# Patient Record
Sex: Male | Born: 1950 | Race: White | Hispanic: No | Marital: Married | Smoking: Former smoker
Health system: Southern US, Community
[De-identification: ages and names within clinical notes are randomized; demographics above are authoritative.]

## PROBLEM LIST (undated history)

## (undated) DIAGNOSIS — N393 Stress incontinence (female) (male): Secondary | ICD-10-CM

## (undated) DIAGNOSIS — Z8546 Personal history of malignant neoplasm of prostate: Secondary | ICD-10-CM

## (undated) DIAGNOSIS — J329 Chronic sinusitis, unspecified: Secondary | ICD-10-CM

## (undated) DIAGNOSIS — N529 Male erectile dysfunction, unspecified: Secondary | ICD-10-CM

## (undated) DIAGNOSIS — Z8719 Personal history of other diseases of the digestive system: Secondary | ICD-10-CM

## (undated) DIAGNOSIS — Z860100 Personal history of colon polyps, unspecified: Secondary | ICD-10-CM

## (undated) DIAGNOSIS — N50819 Testicular pain, unspecified: Secondary | ICD-10-CM

## (undated) DIAGNOSIS — M5136 Other intervertebral disc degeneration, lumbar region: Secondary | ICD-10-CM

## (undated) DIAGNOSIS — K219 Gastro-esophageal reflux disease without esophagitis: Secondary | ICD-10-CM

## (undated) DIAGNOSIS — K5730 Diverticulosis of large intestine without perforation or abscess without bleeding: Secondary | ICD-10-CM

## (undated) DIAGNOSIS — M51369 Other intervertebral disc degeneration, lumbar region without mention of lumbar back pain or lower extremity pain: Secondary | ICD-10-CM

## (undated) DIAGNOSIS — K648 Other hemorrhoids: Secondary | ICD-10-CM

## (undated) HISTORY — PX: INGUINAL HERNIA REPAIR: SUR1180

## (undated) HISTORY — PX: COLONOSCOPY: SHX174

## (undated) HISTORY — PX: HEMORROIDECTOMY: SUR656

## (undated) HISTORY — DX: Gastro-esophageal reflux disease without esophagitis: K21.9

## (undated) HISTORY — DX: Chronic sinusitis, unspecified: J32.9

---

## 1987-09-23 HISTORY — PX: ETHMOIDECTOMY: SHX5197

## 2000-02-10 ENCOUNTER — Ambulatory Visit (HOSPITAL_COMMUNITY): Admission: RE | Admit: 2000-02-10 | Discharge: 2000-02-10 | Payer: Self-pay | Admitting: Family Medicine

## 2000-02-10 ENCOUNTER — Encounter: Payer: Self-pay | Admitting: Family Medicine

## 2000-02-25 ENCOUNTER — Encounter: Payer: Self-pay | Admitting: Family Medicine

## 2000-02-25 ENCOUNTER — Ambulatory Visit (HOSPITAL_COMMUNITY): Admission: RE | Admit: 2000-02-25 | Discharge: 2000-02-25 | Payer: Self-pay | Admitting: Family Medicine

## 2004-08-07 ENCOUNTER — Ambulatory Visit (HOSPITAL_COMMUNITY): Admission: RE | Admit: 2004-08-07 | Discharge: 2004-08-07 | Payer: Self-pay | Admitting: Gastroenterology

## 2004-08-07 ENCOUNTER — Encounter (INDEPENDENT_AMBULATORY_CARE_PROVIDER_SITE_OTHER): Payer: Self-pay | Admitting: Specialist

## 2007-02-25 ENCOUNTER — Ambulatory Visit (HOSPITAL_COMMUNITY): Admission: RE | Admit: 2007-02-25 | Discharge: 2007-02-25 | Payer: Self-pay | Admitting: General Surgery

## 2007-02-25 ENCOUNTER — Encounter (INDEPENDENT_AMBULATORY_CARE_PROVIDER_SITE_OTHER): Payer: Self-pay | Admitting: General Surgery

## 2008-12-07 ENCOUNTER — Encounter: Admission: RE | Admit: 2008-12-07 | Discharge: 2008-12-07 | Payer: Self-pay | Admitting: Family Medicine

## 2010-08-01 ENCOUNTER — Encounter (INDEPENDENT_AMBULATORY_CARE_PROVIDER_SITE_OTHER): Payer: Self-pay | Admitting: Urology

## 2010-08-01 ENCOUNTER — Inpatient Hospital Stay (HOSPITAL_COMMUNITY): Admission: RE | Admit: 2010-08-01 | Discharge: 2010-08-02 | Payer: Self-pay | Admitting: Urology

## 2010-08-01 HISTORY — PX: ROBOT ASSISTED LAPAROSCOPIC RADICAL PROSTATECTOMY: SHX5141

## 2010-09-22 HISTORY — PX: CATARACT EXTRACTION W/ INTRAOCULAR LENS IMPLANT: SHX1309

## 2010-09-22 HISTORY — PX: RETINAL DETACHMENT SURGERY: SHX105

## 2010-12-01 ENCOUNTER — Emergency Department (HOSPITAL_COMMUNITY)
Admission: EM | Admit: 2010-12-01 | Discharge: 2010-12-01 | Disposition: A | Discharge status: Home / Self Care | Payer: BC Managed Care – PPO | Attending: Emergency Medicine | Admitting: Emergency Medicine

## 2010-12-01 DIAGNOSIS — N4830 Priapism, unspecified: Secondary | ICD-10-CM | POA: Insufficient documentation

## 2010-12-03 LAB — BASIC METABOLIC PANEL
BUN: 11 mg/dL (ref 6–23)
CO2: 27 mEq/L (ref 19–32)
Calcium: 9.2 mg/dL (ref 8.4–10.5)
Chloride: 109 mEq/L (ref 96–112)
Creatinine, Ser: 0.95 mg/dL (ref 0.4–1.5)
GFR calc Af Amer: >60 mL/min (ref >60)
GFR calc non Af Amer: >60 mL/min (ref >60)
Glucose, Bld: 101 mg/dL — ABNORMAL HIGH (ref 70–99)
Potassium: 4.3 mEq/L (ref 3.5–5.1)
Sodium: 143 mEq/L (ref 135–145)

## 2010-12-03 LAB — ABO/RH: ABO/RH(D): O POS

## 2010-12-03 LAB — SURGICAL PCR SCREEN: MRSA, PCR: NEGATIVE

## 2010-12-03 LAB — HEMOGLOBIN AND HEMATOCRIT, BLOOD
Hemoglobin: 12.5 g/dL — ABNORMAL LOW (ref 13.0–17.0)
Hemoglobin: 14.3 g/dL (ref 13.0–17.0)

## 2010-12-03 LAB — CBC
RBC: 5.07 MIL/uL (ref 4.22–5.81)
WBC: 6.5 10*3/uL (ref 4.0–10.5)

## 2010-12-03 LAB — TYPE AND SCREEN

## 2010-12-25 ENCOUNTER — Ambulatory Visit
Admission: RE | Admit: 2010-12-25 | Discharge: 2010-12-25 | Disposition: A | Discharge status: Home / Self Care | Payer: BC Managed Care – PPO | Source: Ambulatory Visit | Attending: Family Medicine | Admitting: Family Medicine

## 2010-12-25 ENCOUNTER — Other Ambulatory Visit: Payer: Self-pay | Admitting: Family Medicine

## 2010-12-25 DIAGNOSIS — R51 Headache: Secondary | ICD-10-CM

## 2010-12-25 DIAGNOSIS — M545 Low back pain: Secondary | ICD-10-CM

## 2011-02-04 NOTE — Op Note (Signed)
NAME:  Gavin Fowler, SASSO NO.:  [id number]   MEDICAL RECORD NO.:  [id number]          PATIENT TYPE:  AMB   LOCATION:  DAY                          FACILITY:  [facility]   PHYSICIAN:  Ollen Gross. Vernell Morgans, M.D. DATE OF BIRTH:  03-28-1951   DATE OF PROCEDURE:  02/25/2007  DATE OF DISCHARGE:                               OPERATIVE REPORT   PREOPERATIVE DIAGNOSIS:  Left inguinal hernia.   POSTOPERATIVE DIAGNOSIS:  Left indirect inguinal hernia.   PROCEDURE:  Left inguinal hernia repair with mesh.   SURGEON:  Ollen Gross. Vernell Morgans, M.D.   ANESTHESIA:  General endotracheal.   PROCEDURE:  After informed consent was obtained, the patient was brought  to the operating room and placed in the supine position on the operating  table.  After induction of general anesthesia, the patient's left groin  and abdomen were prepped with Betadine and draped in the usual sterile  manner.  The left groin area was infiltrated 0.25% Marcaine with  epinephrine.  A small incision was made from the edge of the pubic  tubercle on the left towards the anterior superior iliac spine.  This  incision was carried down through the skin and subcutaneous tissue  sharply with electrocautery.  Two small bridging veins were encountered  that were clamped with hemostats, divided, and ligated with 3-0 silk  ties.  The rest of the incision was opened sharply with electrocautery  until the fascia of the external oblique was encountered.  A Weitlaner  retractor was deployed.  The fascia of the external oblique was opened  along its fibers towards the apex of the external ring.  Blunt  dissection was then carried out of the cord structures until the cord  structures could be surrounded between two fingers.  A 1/2-inch Penrose  drain was placed around the cord structures for retraction purposes.  The cord structures were gently skeletonized and a lipoma of the cord as  well as the hernia sac were able to be identified and  gently separated  from the rest of the cord structures.  The ilioinguinal nerve and  iliofemoral nerves were both identified and were preserved.  Once the  cord lipoma was separated from the rest of the cord structures, it was  ligated at its base by clamping it with a hemostat and divided it and  ligating with a 2-0 silk tie.  The hernia sac, itself, was then opened  and there were no visceral contents within the sac.  The sac was ligated  near its base with a 2-0 silk suture ligature and the distal sac was  excised sharply and sent to pathology for identification.  The stump of  the sac was allowed to retract back beneath the floor of the canal.  The  floor of the canal was then reinforced with a couple of 0 Vicryl  stitches.  A piece of 3 x 6 Ultrapro mesh was chosen and cut to fit. The  mesh was sewed inferiorly to the shelving edge of the inguinal ligament  with a running 2-0 Prolene stitch.  The tails of  0 Vicryl stitch next to  the cord were brought through the mesh and tied down to anchor the mesh.  At that point, the tails were cut in the mesh laterally and these tails  were wrapped around the cord structures. Superiorly, the mesh was sewed  to the muscular aponeurotic strength layer of the transversalis with  interrupted 2-0 Prolene vertical mattress stitches.  The tails of the  mesh were anchored lateral to the cord to the shelving edge of the  inguinal ligament with an interrupted 2-0 Prolene stitch.  Once this was  accomplished, the mesh was in good position without any tension.  The  wound was irrigated with copious amounts of saline and the fascia of the  external oblique was then closed with a running 2-0 Vicryl stitch. The  wound was then infiltrated with 0.25% Marcaine.  Care was taken not to  involve the ilioinguinal nerve when closing the fascia.  The  subcutaneous fascia was then closed with a running 3-0 Vicryl stitch and  the skin was closed with a running 4-0  Monocryl subcuticular stitch.  Benzoin, Steri-Strips and Dermabond dressing was then applied.  The  patient  tolerated the procedure well.  At the end of the case, all needle,  sponge, and instrument counts were correct.  The patient was awakened  and taken to recovery in stable condition.  The patient's testicle was  in the scrotum at the end of case.      Ollen Gross. Vernell Morgans, M.D.  Electronically Signed     PST/MEDQ  D:  02/25/2007  T:  02/25/2007  Job:  [id number]

## 2011-02-07 NOTE — Op Note (Signed)
NAME:  Gavin Fowler, Gavin Fowler NO.:  [id number]   MEDICAL RECORD NO.:  [id number]          PATIENT TYPE:  AMB   LOCATION:  ENDO                         FACILITY:  [facility]   PHYSICIAN:  Graylin Shiver, M.D.   DATE OF BIRTH:  [birth year]   DATE OF PROCEDURE:  08/07/2004  DATE OF DISCHARGE:                                 OPERATIVE REPORT   PROCEDURE PERFORMED:  Colonoscopy with polypectomy and biopsy.   INDICATIONS FOR PROCEDURE:  Family history of colon cancer.   Informed consent was obtained after explanation of the risks of bleeding,  infection, and perforation.   PREMEDICATIONS:  Fentanyl 100 mcg  IV, Versed 10 mg IV.   DESCRIPTION OF PROCEDURE:  With the patient in the left lateral decubitus  position, a rectal exam was performed and no masses were felt.  The Olympus  colonoscope was inserted into the rectum and advanced around the colon to  the cecum.  Cecal landmarks were identified.  The cecum and ascending colon  were normal.  In the transverse colon there were two small 3 mm polyps  biopsied off with cold forceps.  The descending colon looked normal.  A few  diverticula were noted in the sigmoid region.  Also in the sigmoid there was  an 8 mm pedunculated polyp snared and removed by snare cautery technique.  The polyp was retrieved and the cautery site looked good.  In the rectum  there was a small 3 mm polyp biopsied off with cold forceps.  The patient  tolerated the procedure well without complications.   IMPRESSION:  Colon polyps.   PLAN:  The pathology will be checked.       SFG/MEDQ  D:  08/07/2004  T:  08/07/2004  Job:  [id number]   cc:   Meredith Staggers, M.D.  [address]  [place]  [place] [address]  Fax: [phone]

## 2011-07-10 LAB — BASIC METABOLIC PANEL
BUN: 10
Creatinine, Ser: 0.98
GFR calc non Af Amer: >60
Glucose, Bld: 94
Potassium: 3.9

## 2011-07-10 LAB — CBC
HCT: 47.6
MCV: 88.9
Platelets: 276
RDW: 13.9

## 2011-07-10 LAB — DIFFERENTIAL
Basophils Absolute: 0
Eosinophils Absolute: 0.1
Eosinophils Relative: 1
Lymphocytes Relative: 21
Neutrophils Relative %: 70

## 2011-12-28 IMAGING — CR DG CHEST 2V
2 series · 2 of 2 positions shown · non-contrast
Comparison: 12/07/2008 study

CLINICAL DATA: History of prostatic carcinoma.  Previous tobacco
smoker.  Preoperative cardiopulmonary evaluation.

CHEST - 2 VIEW

[w chest pa]
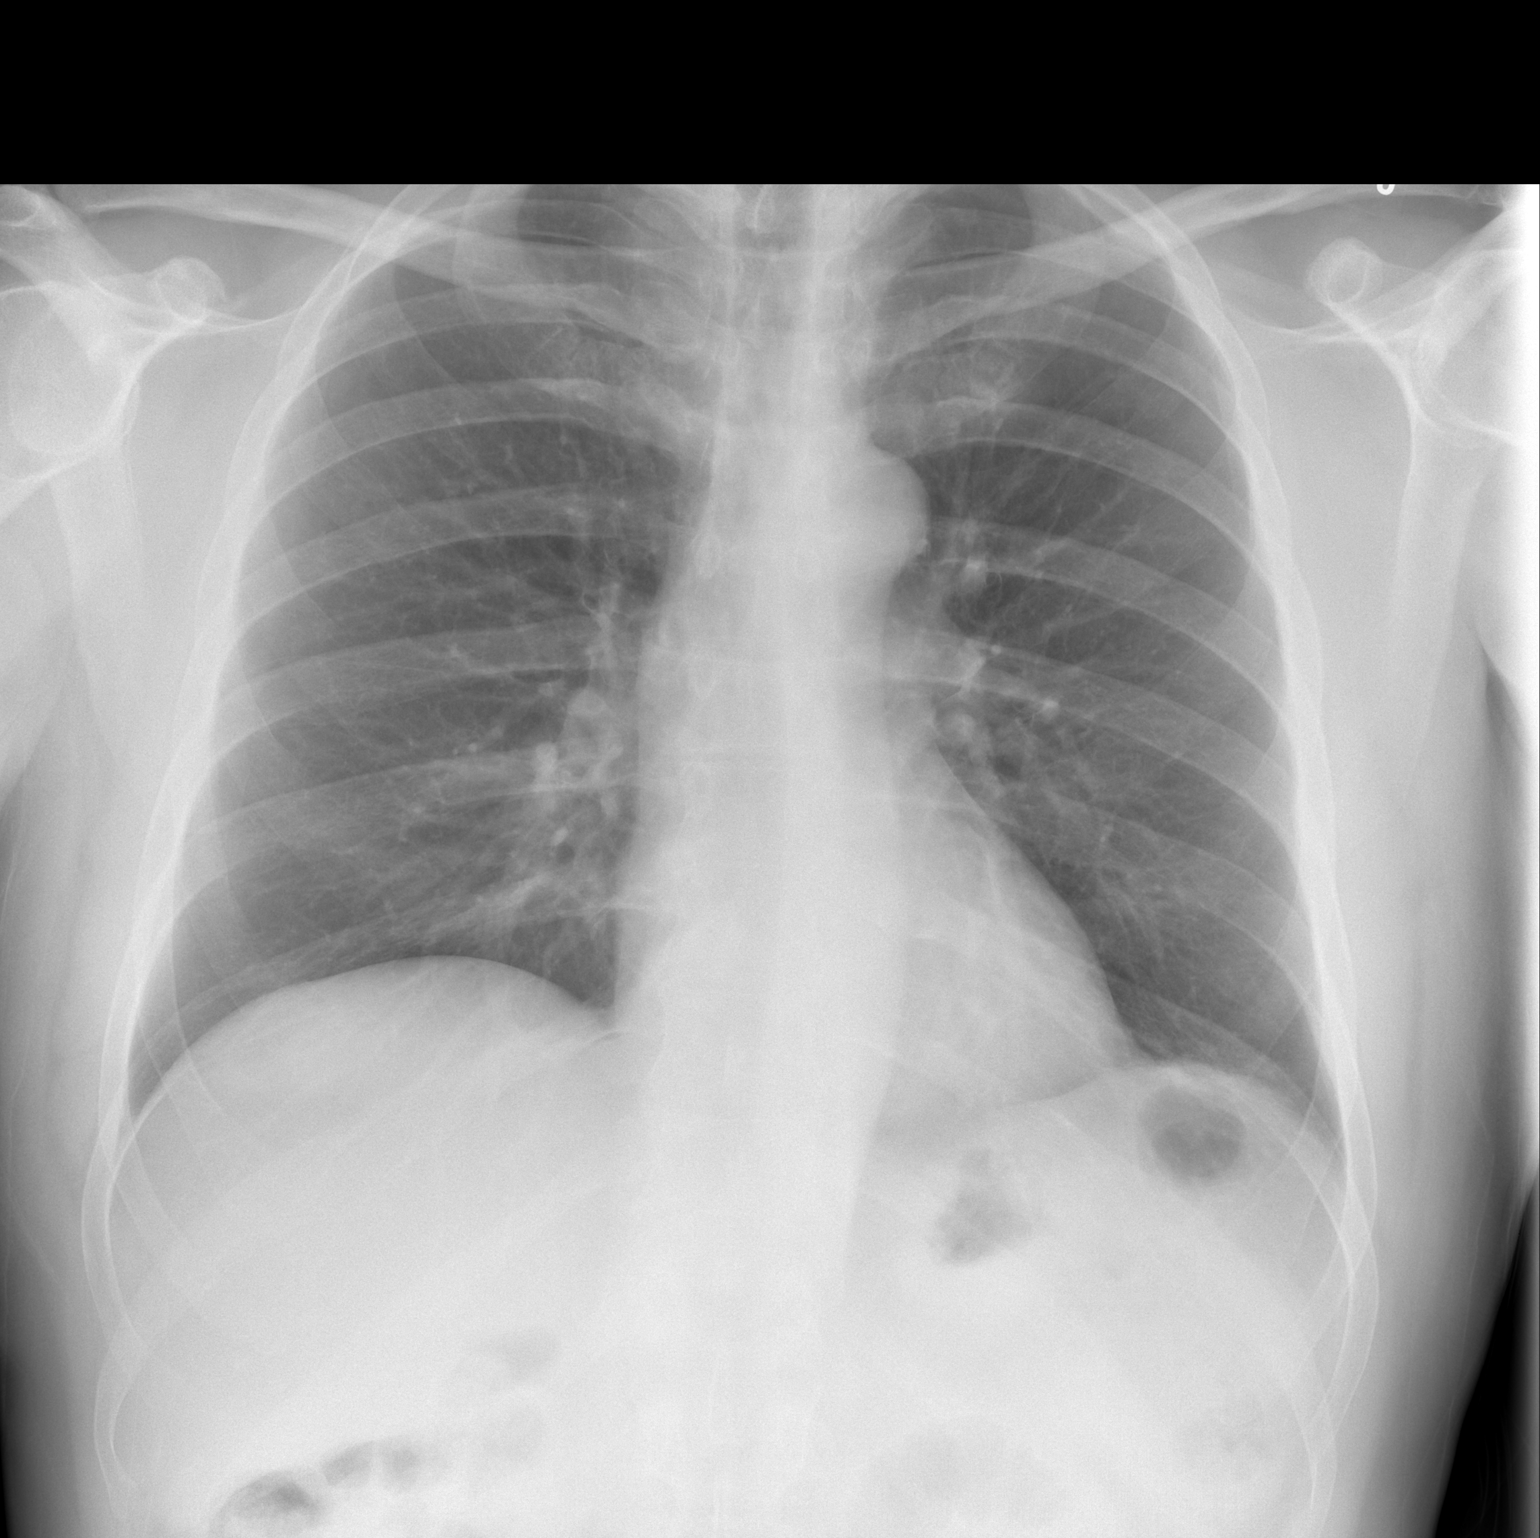

[w chest lat]
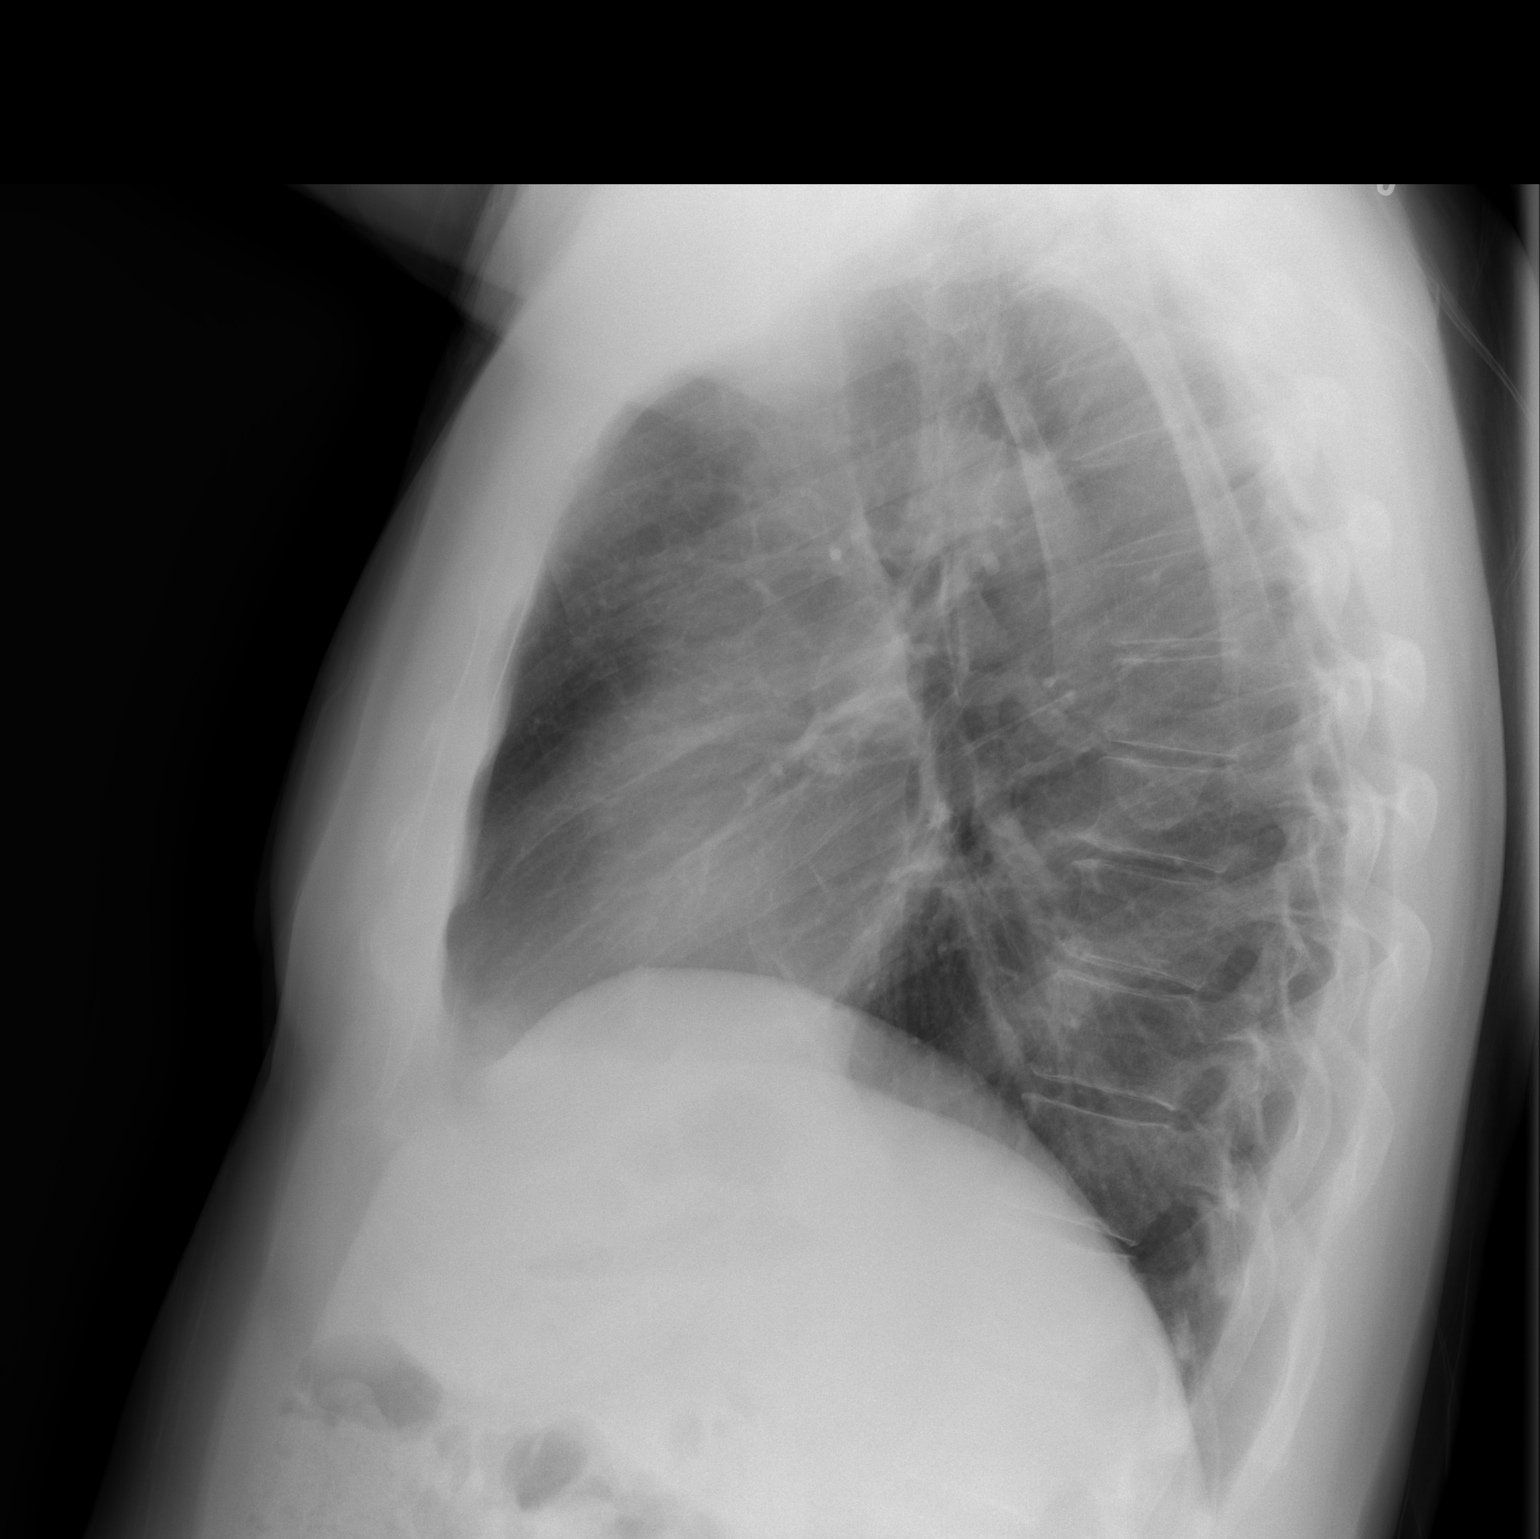

[2 of 2 positions shown; findings below may reference images not displayed]

FINDINGS: The cardiac silhouette is normal size and shape. The
lungs are well aerated and free of infiltrates. No pleural
abnormality is evident.  There is minimal degenerative spondylosis.
IMPRESSION: No acute or active cardiopulmonary disease is seen.

## 2012-09-28 ENCOUNTER — Ambulatory Visit (INDEPENDENT_AMBULATORY_CARE_PROVIDER_SITE_OTHER): Payer: BC Managed Care – PPO | Admitting: General Surgery

## 2012-09-28 ENCOUNTER — Encounter (INDEPENDENT_AMBULATORY_CARE_PROVIDER_SITE_OTHER): Payer: Self-pay | Admitting: General Surgery

## 2012-09-28 VITALS — BP 180/80 | HR 78 | Temp 97.9°F | Resp 18 | Ht 77.0 in | Wt 230.0 lb

## 2012-09-28 DIAGNOSIS — K649 Unspecified hemorrhoids: Secondary | ICD-10-CM

## 2012-09-28 DIAGNOSIS — K648 Other hemorrhoids: Secondary | ICD-10-CM

## 2012-09-28 NOTE — Progress Notes (Signed)
Chief Complaint  Patient presents with  . Hemorrhoids    HISTORY: Gavin Fowler is a 62 y.o. male who presents to the office with rectal hemorrhoid prolapse.  Other symptoms include slight discomfort and bleeding.  He has tried prep H and diet modifications in the past with some success.  Nothing makes the symptoms worse.  This had been occurring for at least 5 yrs.  He has had them injected a few times.  His bowel habits are regular and his bowel movements are soft.  His fiber intake is good.  His last colonoscopy was 3.5 yrs ago and is due again in the summer.  He does have prolapsing tissue that he has to reduce manually.      Past Medical History  Diagnosis Date  . Cancer   . GERD (gastroesophageal reflux disease)     Diverticulitis  Past Surgical History  Procedure Date  . Ethmoidectomy 1989  . Hernia repair 2007  . Prostate surgery 2011        Current Outpatient Prescriptions  Medication Sig Dispense Refill  . PANTOPRAZOLE SODIUM PO Take 40 mg by mouth daily.      . Pseudoephedrine HCl (SUDAFED PO) Take by mouth as needed.      . Sildenafil Citrate (VIAGRA PO) Take by mouth as needed.          Not on File    family history: Father with colon cancer  History   Social History  . Marital Status: Married    Spouse Name: N/A    Number of Children: N/A  . Years of Education: N/A   Social History Main Topics  . Smoking status: Former Smoker    Quit date: 09/22/1990  . Smokeless tobacco: None  . Alcohol Use: Yes  . Drug Use: No  . Sexually Active: None   Other Topics Concern  . None   Social History Narrative  . None      REVIEW OF SYSTEMS - PERTINENT POSITIVES ONLY: Review of Systems - General ROS: negative for - chills, fever or weight loss Hematological and Lymphatic ROS: negative for - bleeding problems, blood clots or night sweats Respiratory ROS: no cough, shortness of breath, or wheezing Cardiovascular ROS: no chest pain or dyspnea on  exertion Gastrointestinal ROS: positive for - blood in stools negative for - abdominal pain, constipation or diarrhea Genito-Urinary ROS: positive for - erectile dysfunction negative for - change in urinary stream or dysuria  EXAM: Filed Vitals:   09/28/12 0900  BP: 180/80  Pulse: 78  Temp: 97.9 F (36.6 C)  Resp: 18    General appearance: alert and cooperative Resp: clear to auscultation bilaterally Cardio: regular rate and rhythm GI: soft, non-tender; bowel sounds normal; no masses,  no organomegaly   Procedure: Anoscopy Surgeon: Gavin Fus Diagnosis: hemorroids  Assistant: Gavin Fowler After the risks and benefits were explained, verbal consent was obtained for above procedure  Anesthesia: none Findings: RA and LL skin tags, R posterior grade 3 hemorrhoid, L lateral grade 4 hemorrhoid, min R ant     ASSESSMENT AND PLAN: Gavin Fowler is a 62 y.o. M who presents to the office with prolapsing hemorrhoids.  These cause him discomfort.  He has some occasional bleeding.  We discussed all of the options for treatment of hemorrhoids, including medical management with a high fiber diet, which I believe he is currently doing.  I told him that I thought that [facility] would be his best option.  I have given him  some written information on this as well.  He will call the office when he is ready for any procedures.    Vanita Panda, MD Colon and Rectal Surgery / General Surgery [facility]      Visit Diagnoses: 1. Hemorrhoid prolapse     Primary Care Physician: Allean Found, MD

## 2012-09-28 NOTE — Patient Instructions (Signed)

## 2012-10-06 ENCOUNTER — Ambulatory Visit (INDEPENDENT_AMBULATORY_CARE_PROVIDER_SITE_OTHER): Payer: BC Managed Care – PPO | Admitting: Family Medicine

## 2012-10-06 VITALS — BP 145/87 | HR 91 | Temp 98.7°F | Resp 16 | Ht 77.0 in | Wt 234.0 lb

## 2012-10-06 DIAGNOSIS — J0100 Acute maxillary sinusitis, unspecified: Secondary | ICD-10-CM

## 2012-10-06 DIAGNOSIS — N529 Male erectile dysfunction, unspecified: Secondary | ICD-10-CM

## 2012-10-06 MED ORDER — SILDENAFIL CITRATE 100 MG PO TABS: 100.0000 mg | ORAL_TABLET | ORAL | Status: DC | PRN

## 2012-10-06 MED ORDER — AMOXICILLIN 500 MG PO CAPS: 1000.0000 mg | ORAL_CAPSULE | Freq: Three times a day (TID) | ORAL | Status: DC

## 2012-10-06 MED ORDER — FLUTICASONE PROPIONATE 50 MCG/ACT NA SUSP: 2.0000 | Freq: Every day | NASAL | Status: DC

## 2012-10-06 NOTE — Progress Notes (Signed)
Subjective:    Patient ID: Gavin Fowler, male    DOB: 04/01/1951, 62 y.o.   MRN: [id number]  HPI  Gavin Fowler is a delightful 62 yo male who presents with horrible congestion. Is leaving town for a 4d wkend so wife made him come in.  Has had pna 3 times throughout life and often starts with sinus cong. For the past mo has had sinus pain taking sudafed but now congestion worsened with green gunk, post-nasal drip.  Uses nasal saline spray bid regularly as has chronic sinus issues. Tried a sinus rinse once in the past and got the worst sinus infection of his life so reluctant to retry. Did get flu shot this yr.  Pt had a total prostatectomy 2 yrs ago - was told that all of his nerves were spared but since then he has continued to have urinary incontinence - just was able to stop wearing a pad.  Has had complete loss of erection since - has a vacuum pump but difficult to use as his right teste has been permanently elevated since a small right inguinal hernia repair (done with just suture, no mesh) done at the same time as his prostatectomy.  It has lost the cremasteric reflex.  He has also tried a cock ring which helps in combination with 2 viagra 25mg  some but still not the same - penis feels dead.  His urologist did not want to prescribe him viagra as his blood flow was fine but he hasn't found anything else that could help. Past Medical History  Diagnosis Date  . Cancer   . GERD (gastroesophageal reflux disease)    Current Outpatient Prescriptions on File Prior to Visit  Medication Sig Dispense Refill  . PANTOPRAZOLE SODIUM PO Take 40 mg by mouth daily.      . Pseudoephedrine HCl (SUDAFED PO) Take by mouth as needed.      . sildenafil (VIAGRA) 100 MG tablet Take 1 tablet (100 mg total) by mouth as needed for erectile dysfunction.  30 tablet  3  . fluticasone (FLONASE) 50 MCG/ACT nasal spray Place 2 sprays into the nose daily.  16 g  6   No Known Allergies    Review of Systems    Constitutional: Positive for fatigue. Negative for fever, chills, diaphoresis, activity change, appetite change and unexpected weight change.  HENT: Positive for congestion, sore throat, rhinorrhea, postnasal drip and sinus pressure. Negative for ear pain, mouth sores, neck pain and neck stiffness.   Respiratory: Positive for cough. Negative for shortness of breath.   Cardiovascular: Negative for chest pain.  Gastrointestinal: Negative for nausea, vomiting, abdominal pain, diarrhea and constipation.  Genitourinary: Positive for urgency, frequency and enuresis. Negative for discharge, penile swelling, scrotal swelling and penile pain.  Musculoskeletal: Negative for myalgias and arthralgias.  Skin: Negative for rash.  Neurological: Positive for headaches. Negative for syncope.  Hematological: Negative for adenopathy.  Psychiatric/Behavioral: Positive for sleep disturbance.      BP 145/87  Pulse 91  Temp 98.7 F (37.1 C)  Resp 16  Ht 6\' 5"  (1.956 m)  Wt 234 lb (106.142 kg)  BMI 27.75 kg/m2 Objective:   Physical Exam  Constitutional: He is oriented to person, place, and time. He appears well-developed and well-nourished. No distress.  HENT:  Head: Normocephalic and atraumatic.  Right Ear: Tympanic membrane, external ear and ear canal normal.  Left Ear: Tympanic membrane, external ear and ear canal normal.  Nose: Mucosal edema and rhinorrhea present. Right sinus exhibits  maxillary sinus tenderness. Left sinus exhibits maxillary sinus tenderness.  Mouth/Throat: Uvula is midline and mucous membranes are normal. Posterior oropharyngeal erythema present. No oropharyngeal exudate or posterior oropharyngeal edema.  Eyes: Conjunctivae normal are normal. Right eye exhibits no discharge. Left eye exhibits no discharge. No scleral icterus.  Neck: Normal range of motion. Neck supple. No thyromegaly present.  Cardiovascular: Normal rate, regular rhythm, normal heart sounds and intact distal pulses.    Pulmonary/Chest: Effort normal and breath sounds normal. No respiratory distress.  Lymphadenopathy:       Head (right side): Submandibular adenopathy present.       Head (left side): Submandibular adenopathy present.    He has cervical adenopathy.       Left cervical: Superficial cervical adenopathy present.       Right: No supraclavicular adenopathy present.       Left: No supraclavicular adenopathy present.  Neurological: He is alert and oriented to person, place, and time.  Skin: Skin is warm and dry. He is not diaphoretic. No erythema.  Psychiatric: He has a normal mood and affect. His behavior is normal.       Assessment & Plan:  1. Elevated blood pressure - likely due to the sudafed, gave info on dash diet, recheck at f/u. 2. Acute sinusitis - flonase and amoxicillin. Cont nasal saline 3. Erectile dysfunction - try higher dose viagra, check testosterone.  Consider recheck 1st thing in the a.m. with free, total, and sex-hormone binding globulin

## 2012-10-06 NOTE — Patient Instructions (Addendum)

## 2012-10-07 LAB — TESTOSTERONE: Testosterone: 227.46 ng/dL — ABNORMAL LOW (ref 300–890)

## 2012-10-13 ENCOUNTER — Other Ambulatory Visit: Payer: Self-pay | Admitting: Radiology

## 2012-10-13 DIAGNOSIS — N529 Male erectile dysfunction, unspecified: Secondary | ICD-10-CM

## 2012-10-15 ENCOUNTER — Encounter: Payer: Self-pay | Admitting: Family Medicine

## 2012-10-15 ENCOUNTER — Ambulatory Visit (INDEPENDENT_AMBULATORY_CARE_PROVIDER_SITE_OTHER): Payer: BC Managed Care – PPO | Admitting: Family Medicine

## 2012-10-15 VITALS — BP 160/80 | HR 94 | Temp 99.4°F | Resp 16 | Ht 76.0 in | Wt 231.0 lb

## 2012-10-15 DIAGNOSIS — N509 Disorder of male genital organs, unspecified: Secondary | ICD-10-CM

## 2012-10-15 DIAGNOSIS — N529 Male erectile dysfunction, unspecified: Secondary | ICD-10-CM

## 2012-10-15 LAB — LIPID PANEL
HDL: 52 mg/dL (ref >39)
LDL Cholesterol: 151 mg/dL — ABNORMAL HIGH (ref 0–99)
Total CHOL/HDL Ratio: 4.5 Ratio
VLDL: 29 mg/dL (ref 0–40)

## 2012-10-15 LAB — PSA: PSA: <0.01 ng/mL (ref <=4.00)

## 2012-10-15 NOTE — Progress Notes (Signed)
Subjective:    Patient ID: Gavin Fowler, male    DOB: 01/21/1951, 62 y.o.   MRN: [id number] Chief Complaint  Patient presents with  . discuss labs    testosterone    HPI  Gavin Fowler is a delightful 62 yo man who is s/p complete prostatectomy 2/2 prostate cancer 2 yrs previously with repair of a small right inguinal hernia by suture closure during the surgery. Unfortunately, since his surgery he has suffered from severe incontinence - which is still slowly improving and complete lack of erectile function.  He has tried viagra w/o help - if he takes a 50mg  he can somewhat get an erection - has not tried 100 mg - but his urologist told him that the phosphodiesterase inhibitors were unlikely to help as he had excellent blood flow to his penis.  He tried injectable trimix which caused severe priapism resulting in an ER visit and almost had a stroke resulting in a migraine HA for 4d after an overdose of a pseudoephedrine shot - also humiliating as the physician told a young male assistant to massage his penis to help it relax and insinuated that he was being inappropriate when he stated that he would prefer his wife to do that.  The only thing that he found has worked is a ring that goes around the base of the scrotum and the penis and tightens it - holding blood in the erect penis - used in combo with viagra or the penis vacuum pump. Unfortunately, the vacuum pump is very painful to use as he is unable to move his right testicle out of the way since the inguinal hernia repair.  His right testicle is constantly elevated, has lost its cremasteric reflex, and is now almost constantly painful since the hernia repair.  The hernia repair was done by a different surgeon than that who did his robotic prostatectomy though they were done at the same time.  He has considered having a penile implant placed but that is his last resort as it is so permanent and there could be complications with even this easy surgery  - as he has already had complications with others.  His urologist was at [facility] in [place] but he did go for a second opinion when he has had complications to [facility] office - that urologist diagnosed him with a testicular infection after a painful testicular exam and so completed a course of antibiotics w/o any improvement. His prior PCP tried him on some meloxicam for the inflammation which did help the pain some while he was on it but as soon as he stopped taking it, the testicular pain returned in full.  He does have an appt with another urologist - Dr. Malen Gauze - at [facility] office in several days.  In addition, he is still seeing a PT for these concerns - Ruben Gottron.  Fortunately, his PSA in the past 2 yrs has continued to be undetectable with the exception of one lab draw several mos ago which is assumed ot be an error as repeat testing came back as 0 again.  He recently obtained his medical records from [facility] for his second opinion and to transfer his PCP here and read through them - he did note that in many physical exams he was listed as circumcised, when indeed he is not, and many physicians had signed off on notes who he has never met. Gavin Fowler has not yet tried the viagra 100mg  that  I prescribed at last OV due to expense. He came in this morning for fasting qam labs ordered below. Certainly this whole situation has affected his mood. He is not depressed but to find he has cancer and then have so many complications from the surgery and from repair of something that wasn't even bothering him has certainly taken a toll on his mental health and he does occasional have episodes of being sad and down over his condition and his changed romantic relationship with his wife.  Between multiple surgeries, exams, physician and PT interventions, he has lost all privacy and inhibition with discussing and treating his condition. He had never had  his testosterone checked before as he figured even if he had low testosterone, there was nothing that could be done about it since he has a h/o prostate cancer. However, since we discussed that it was at least worth checking at our last visit and it did return low, he read online about people's great experience with testosterone replacement even after total prostatectomy for prostate cancer - there was a prostate cancer forum from the [facility] that brought him great hope to hear other individual's stories about how much better they felt after T-replacement.   Has some hemorrhoid problems and does not to see a specialist - seen at [place] and rec some surgery but would rather try to handle flairs w/ just his family physician (me) and minimal surgical interventions if able.    Past Medical History  Diagnosis Date  . Cancer   . GERD (gastroesophageal reflux disease)    Past Surgical History  Procedure Date  . Ethmoidectomy 1989  . Hernia repair 2007  . Prostate surgery 2011   Current Outpatient Prescriptions on File Prior to Visit  Medication Sig Dispense Refill  . amoxicillin (AMOXIL) 500 MG capsule Take 2 capsules (1,000 mg total) by mouth 3 (three) times daily.  60 capsule  0  . fluticasone (FLONASE) 50 MCG/ACT nasal spray Place 2 sprays into the nose daily.  16 g  6  . PANTOPRAZOLE SODIUM PO Take 40 mg by mouth daily.      . Pseudoephedrine HCl (SUDAFED PO) Take by mouth as needed.      . sildenafil (VIAGRA) 100 MG tablet Take 1 tablet (100 mg total) by mouth as needed for erectile dysfunction.  30 tablet  3   No Known Allergies Family History  Problem Relation Age of Onset  . Colon cancer Father    History   Social History  . Marital Status: Married    Spouse Name: N/A    Number of Children: N/A  . Years of Education: N/A   Occupational History  . Not on file.   Social History Main Topics  . Smoking status: Former Smoker    Quit date: 09/22/1990  . Smokeless  tobacco: Not on file  . Alcohol Use: Yes  . Drug Use: No  . Sexually Active: Not on file   Other Topics Concern  . Not on file   Social History Narrative  . 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 He is very happily married to his highschool sweetheart and has several children in college.   Review of Systems  Respiratory: Negative for shortness of breath.   Cardiovascular: Negative for chest pain and palpitations.  Gastrointestinal: Positive for anal bleeding and rectal pain.  Genitourinary: Positive for urgency,  enuresis and testicular pain. Negative for dysuria, hematuria, decreased urine volume, discharge, penile swelling, scrotal swelling, genital sores and penile pain.  Skin: Negative for rash.  Psychiatric/Behavioral: Negative for sleep disturbance and dysphoric mood. The patient is not nervous/anxious.       BP 160/80  Pulse 94  Temp 99.4 F (37.4 C) (Oral)  Resp 16  Ht 6\' 4"  (1.93 m)  Wt 231 lb (104.781 kg)  BMI 28.12 kg/m2  SpO2 96% Objective:   Physical Exam  Constitutional: He is oriented to person, place, and time. He appears well-developed and well-nourished. No distress.  HENT:  Head: Normocephalic and atraumatic.  Eyes: Conjunctivae normal are normal. Pupils are equal, round, and reactive to light. No scleral icterus.  Neck: Normal range of motion. Neck supple. No thyromegaly present.  Cardiovascular: Normal rate, regular rhythm, normal heart sounds and intact distal pulses.   Pulmonary/Chest: Effort normal and breath sounds normal. No respiratory distress.  Musculoskeletal: He exhibits no edema.  Lymphadenopathy:    He has no cervical adenopathy.  Neurological: He is alert and oriented to person, place, and time.  Skin: Skin is warm and dry. He is not diaphoretic.  Psychiatric: He has a normal mood and affect. His behavior is normal.      Assessment & Plan:   1.  Impotence of organic origin  Sex hormone binding globulin, Testosterone, free, total, FSH/LH, Lipid panel, PSA, Follicle stimulating hormone, Luteinizing hormone  2. Testicular abnormality  US Pelvis Complete   I gave Gavin Fowler several abstracts from recent literature studies and review article demonstrating that no adverse outcome has been identified from testosterone replacement after total prostatectomy as long as the PSA was 0 and it is followed closely.  It is theorized that the prostate cancer's testosterone receptors are likely fully stimulated even at low levels of endogenous testosterone so adding additional testosterone is unlikely to have any worsening. In addition, articles point out that after total prostatectomy for prostate cancer, men whose testosterone levels are normal or high are not suppressed for treatment nor do these men seem to do any worse.  However, we do not have a lot of longevity in the studies so it is very possible that in another 10 yrs some unexpected adverse events could be linked.  Pt will discuss this further with his wife and at next visit, he will likely bring his wife with him for a family discussion re the possibility of starting topical testosterone replacement.  Will check pelvic US of right inguinal area to see if any scar tissue or impendence of the inguinal canal can be seen that would explain the painful elevated teste.  Pt will defer his second opinion visit with Dr. Malen Gauze for now until we have additional info - then I recommend to pt that I get in touch with Dr. Malen Gauze before his visit to let his know his above concerns of low testosterone and hernia repair was to tight so we can hopefully have his concerns more fully addressed at the visit.

## 2012-10-19 ENCOUNTER — Ambulatory Visit
Admission: RE | Admit: 2012-10-19 | Discharge: 2012-10-19 | Disposition: A | Discharge status: Home / Self Care | Payer: BC Managed Care – PPO | Source: Ambulatory Visit | Attending: Family Medicine | Admitting: Family Medicine

## 2012-10-19 DIAGNOSIS — N509 Disorder of male genital organs, unspecified: Secondary | ICD-10-CM

## 2012-11-06 ENCOUNTER — Other Ambulatory Visit: Payer: Self-pay

## 2012-11-23 ENCOUNTER — Ambulatory Visit (INDEPENDENT_AMBULATORY_CARE_PROVIDER_SITE_OTHER): Payer: BC Managed Care – PPO | Admitting: Family Medicine

## 2012-11-23 VITALS — BP 155/80 | HR 71 | Temp 98.7°F | Resp 18 | Ht 76.0 in | Wt 230.0 lb

## 2012-11-23 DIAGNOSIS — IMO0001 Reserved for inherently not codable concepts without codable children: Secondary | ICD-10-CM

## 2012-11-23 DIAGNOSIS — E785 Hyperlipidemia, unspecified: Secondary | ICD-10-CM | POA: Insufficient documentation

## 2012-11-23 DIAGNOSIS — I10 Essential (primary) hypertension: Secondary | ICD-10-CM | POA: Insufficient documentation

## 2012-11-23 MED ORDER — CEPHALEXIN 500 MG PO CAPS: 500.0000 mg | ORAL_CAPSULE | Freq: Four times a day (QID) | ORAL | Status: DC

## 2012-11-23 NOTE — Patient Instructions (Addendum)
You can email me an electronic copy of your medical records to [email or web address] We will refer you to [facility] Dr. Dorena Cookey for your colonoscopy as well as for internal hemorrhoid banding. I am going to put a call into Urology - Dr. Malen Gauze - to see if she has any insight into your right testicular problems and erectile dysfunction. If she is unable to help, I will try [facility].  I will also touch base with endocrinology (specifically thinking about either Dr. Talmage Coin at [facility] or Dr. Romero Belling) to see if they would be willing to evaluate you for borderline low testosterone.  Paronychia Paronychia is an inflammatory reaction involving the folds of the skin surrounding the fingernail. This is commonly caused by an infection in the skin around a nail. The most common cause of paronychia is frequent wetting of the hands (as seen with bartenders, food servers, nurses or others who wet their hands). This makes the skin around the fingernail susceptible to infection by bacteria (germs) or fungus. Other predisposing factors are:  Aggressive manicuring.  Nail biting.  Thumb sucking. The most common cause is a staphylococcal (a type of germ) infection, or a fungal (Candida) infection. When caused by a germ, it usually comes on suddenly with redness, swelling, pus and is often painful. It may get under the nail and form an abscess (collection of pus), or form an abscess around the nail. If the nail itself is infected with a fungus, the treatment is usually prolonged and may require oral medicine for up to one year. Your caregiver will determine the length of time treatment is required. The paronychia caused by bacteria (germs) may largely be avoided by not pulling on hangnails or picking at cuticles. When the infection occurs at the tips of the finger it is called felon. When the cause of paronychia is from the herpes simplex virus (HSV) it is called herpetic whitlow. TREATMENT  When an  abscess is present treatment is often incision and drainage. This means that the abscess must be cut open so the pus can get out. When this is done, the following home care instructions should be followed. HOME CARE INSTRUCTIONS   It is important to keep the affected fingers very dry. Rubber or plastic gloves over cotton gloves should be used whenever the hand must be placed in water.  Keep wound clean, dry and dressed as suggested by your caregiver between warm soaks or warm compresses.  Soak in warm water for fifteen to twenty minutes three to four times per day for bacterial infections. Fungal infections are very difficult to treat, so often require treatment for long periods of time.  For bacterial (germ) infections take antibiotics (medicine which kill germs) as directed and finish the prescription, even if the problem appears to be solved before the medicine is gone.  Only take over-the-counter or prescription medicines for pain, discomfort, or fever as directed by your caregiver. SEEK IMMEDIATE MEDICAL CARE IF:  You have redness, swelling, or increasing pain in the wound.  You notice pus coming from the wound.  You have a fever.  You notice a bad smell coming from the wound or dressing. Document Released: 03/04/2001 Document Revised: 12/01/2011 Document Reviewed: 11/03/2008 [facility] Patient Information 2013 [place], [place].

## 2012-11-23 NOTE — Progress Notes (Signed)
Subjective:    Patient ID: Gavin Fowler, male    DOB: 1951/08/25, 62 y.o.   MRN: [id number] Chief Complaint  Patient presents with  . Wound Infection    x 10 days, has other issues but did not specify.    HPI  His Rt index finger has continued to drain some blood at the nailfold since he cut it 10d ago and still painful - doing a lot of hot water soaks and pouring peroxide on it and topical antibiotics but just won't heal up. No purulent drainage  Still has to use the penis pump to ever achieve an erection that is sufficient for penetration.  He feels that the new bend in the penile shaft that he gets with erection is starting to get worse and the subcutaneous nodules where it bends are starting to become bigger.  The nodules developed after he gave himself an injectable med for an erection and developed priapism so then penis had to be injected with phenylephrine in the ER - he thinks likely scar tissue from the injections/medication.  Thinks that the cremasteric muscle might have been unintentionally pulled into surgical repair of inguinal hernia on right.  His right testicle continues to be elevated at all times. He can't get it to relax even when the other one does. It is almost constantly painful but much more so when he uses the penis pump as then the testicle gets pinched - is in the way. Also after the repair, his epididymis was almost in the front of his testicle so VERY painful when compressed by the penis pump but through PT it is gradually starting to move medially and posterior again. Is continuing to do physical therapy at [facility] - more to get an erection than to fix testicular pain - but has also started seeing Gavin Fowler - with Gavin Fowler [facility] - who has tried to help with nodules and [place] to see if she can get tissue to relax.  Pt was initially followed by Dr. Laverle Fowler post-op at [facility] but he is still having so many problems, he went to [facility] for a  second opinion but didn't seem to have much to offer him.  VERY frustrated and upset that he has been struggling with this for so long.  Viagra doesn't help at all - has tried up to 40mg  at a time. His wife is scared about hormone treatment, the possibility that testosterone supp could possibly encourage return of prostate cancer - though PSA undetectable and s/p complete prostatectomy.  At last visit, I gave him literature on the latest studies showing that testosterone replacement has been used successfully in men with a h/o prostate cancer s/p complete prostatectomy that did very well and no sig difference in recurrance of cancer was noted as long as PSA was undetectable. However, this is still an evolving field and so certainly not "standard" of current use.  [name] is encouraging them to discuss alternative ways to have intimacy rather than penetration, which is fine w/ pt, but he really doesn't want to give up penetration/intercourse for the rest of his life as prior, his wife and him had a VERY healthy sex life. Has 8 kids! Gavin Fowler - 6 girls and 2 boys.  About 3 are still living at home.  Time for his colonoscopy - he is going to go to [place] [facility] to get it done for his copay.  Father passed away from colon cancer at  60 yo - this will be the pt's 3rd colonoscopy. He wants help with his hemorrhoids.    Checks BP at home - between 125-135/70-80  Started red yeast rice for the past 2 wks.  And still on pantopraozle for acid reflux from Gavin Fowler. He also has an appt w/ his prior PCP at [facility] for a full CPE which he scheduled a long time ago.  Wants to transfer his care to our clinic due to our availability and comprehensive care we are able to provide with the combo of the UC and PCP but thinks he might keep his CPE so he can let his prior PCP know - which sounds like a good idea.  Past Medical History  Diagnosis Date  . Cancer   . GERD (gastroesophageal reflux disease)     Current Outpatient Prescriptions on File Prior to Visit  Medication Sig Dispense Refill  . PANTOPRAZOLE SODIUM PO Take 40 mg by mouth daily.      Marland Kitchen amoxicillin (AMOXIL) 500 MG capsule Take 2 capsules (1,000 mg total) by mouth 3 (three) times daily.  60 capsule  0  . fluticasone (FLONASE) 50 MCG/ACT nasal spray Place 2 sprays into the nose daily.  16 g  6  . Pseudoephedrine HCl (SUDAFED PO) Take by mouth as needed.      . sildenafil (VIAGRA) 100 MG tablet Take 1 tablet (100 mg total) by mouth as needed for erectile dysfunction.  30 tablet  3   No current facility-administered medications on file prior to visit.   No Known Allergies   Review of Systems  Genitourinary: Positive for frequency, penile pain and testicular pain. Negative for dysuria, hematuria, discharge, penile swelling, scrotal swelling, enuresis, difficulty urinating and genital sores.  Musculoskeletal: Positive for myalgias and joint swelling.  Skin: Positive for color change and wound.       BP 155/80  Pulse 71  Temp(Src) 98.7 F (37.1 C)  Resp 18  Ht 6\' 4"  (1.93 m)  Wt 230 lb (104.327 kg)  BMI 28.01 kg/m2 Objective:   Physical Exam  Constitutional: He is oriented to person, place, and time. He appears well-developed and well-nourished. No distress.  HENT:  Head: Normocephalic and atraumatic.  Eyes: Conjunctivae are normal. Pupils are equal, round, and reactive to light. No scleral icterus.  Neck: Normal range of motion. Neck supple. No thyromegaly present.  Cardiovascular: Normal rate, regular rhythm, normal heart sounds and intact distal pulses.   Pulmonary/Chest: Effort normal and breath sounds normal. No respiratory distress.  Abdominal: Hernia confirmed negative in the right inguinal area and confirmed negative in the left inguinal area.  Genitourinary: Rectal exam shows internal hemorrhoid and tenderness. Rectal exam shows no external hemorrhoid, no fissure and no mass. Right testis shows tenderness.  Cremasteric reflex is absent on the right side. Uncircumcised. No phimosis, paraphimosis, hypospadias or penile erythema. No discharge found.  Pt demonstrated use of penis pump which was VERY painful for him in part because the base impinges upon his right testicle which won't descent - is held in retraction since his right inguinal hernia repair.  When erect, penis has a moderate curvature to the right - with 2 small palpable subcutaneous nodules at approx 9 oclock near base and the other slightly more distal.  Prolapsed internal hemorrhoid  Musculoskeletal: He exhibits no edema.       Right hand: He exhibits tenderness and swelling. He exhibits no bony tenderness.  Second finger of Rt hand with erythema and tenderness around  nail fold, some mild induration but no fluctuance or warmth. No drainage.  Lymphadenopathy:    He has no cervical adenopathy.  Neurological: He is alert and oriented to person, place, and time.  Skin: Skin is warm and dry. He is not diaphoretic.  Psychiatric: He has a normal mood and affect. His behavior is normal.      Assessment & Plan:  1.  HTN - Elevated often at OV.  OK outside office. Cont to monitor.  2.  HPL -  Cont new red yeast rice supp and recheck in 4-6 mos.  3.  ED with low testosterone in pt with a h/o prostate cancer and s/p complete prostatectomy and Rt testicular pain s/p inguinal surgery repair - recommend second opinion with endocrinology. Pt will hold off for now and pursue a 3rd opinon w/ urology.  Recheck testosterone levels today as a.m. fasting today most accurate. Pt received his second opinion from a urologist at [facility] but her specialty is primarily gyn uro so I have called their office and they have rescheduled him w/ a urologist that specialized in prostate surgeries and erectile dysfunction.  He is understandably reluctant to undergo surgery again or have a permanent implanted device placed as he is still hoping that there may  alternative ways to regain his erectile function - pt perceives that as failure and represents permanent disability and problems to him.  However, I am concerned that especially to get his right testicular concerns resolved, there may only be surgical options.    4.  Hemorrhoids - going to have colonoscopy with [facility] and perhaps they can discuss banding w/ pt.  Avoid constipaiton and make sure internal hemorrhoid is always reduced if it prolapses.  5. 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  I suspect that this is scar tissue from the prior injections and will hopefully loosen further w/ continued therapy, massage, etc.  6.  Paronychia of second finger of right hand - Plan: cephALEXin (KEFLEX) 500 MG capsule, warm soapy water soaks tid and keep covered w/ topical antibiotic ointment to keep moist and encourage draining.  Meds ordered this encounter  Medications  . cephALEXin (KEFLEX) 500 MG capsule    Sig: Take 1 capsule (500 mg total) by mouth 4 (four) times daily.    Dispense:  28 capsule    Refill:  0

## 2013-01-09 ENCOUNTER — Encounter: Payer: Self-pay | Admitting: Family Medicine

## 2013-01-09 DIAGNOSIS — Z800 Family history of malignant neoplasm of digestive organs: Secondary | ICD-10-CM | POA: Insufficient documentation

## 2013-01-09 DIAGNOSIS — N529 Male erectile dysfunction, unspecified: Secondary | ICD-10-CM | POA: Insufficient documentation

## 2013-01-09 DIAGNOSIS — N50819 Testicular pain, unspecified: Secondary | ICD-10-CM | POA: Insufficient documentation

## 2013-02-27 ENCOUNTER — Ambulatory Visit (INDEPENDENT_AMBULATORY_CARE_PROVIDER_SITE_OTHER): Payer: BC Managed Care – PPO | Admitting: Emergency Medicine

## 2013-02-27 VITALS — BP 122/100 | HR 86 | Temp 99.0°F | Resp 16 | Ht 76.0 in | Wt 222.0 lb

## 2013-02-27 DIAGNOSIS — K5732 Diverticulitis of large intestine without perforation or abscess without bleeding: Secondary | ICD-10-CM

## 2013-02-27 LAB — POCT CBC
Hemoglobin: 17.2 g/dL (ref 14.1–18.1)
MCH, POC: 30.9 pg (ref 27–31.2)
MCV: 94.7 fL (ref 80–97)
MPV: 9.6 fL (ref 0–99.8)
RBC: 5.57 M/uL (ref 4.69–6.13)
WBC: 8.5 10*3/uL (ref 4.6–10.2)

## 2013-02-27 MED ORDER — METRONIDAZOLE 500 MG PO TABS: 500.0000 mg | ORAL_TABLET | Freq: Four times a day (QID) | ORAL | Status: DC

## 2013-02-27 MED ORDER — CIPROFLOXACIN HCL 500 MG PO TABS: 500.0000 mg | ORAL_TABLET | Freq: Two times a day (BID) | ORAL | Status: DC

## 2013-02-27 NOTE — Progress Notes (Signed)
[facility] [address] [place] [address] [phone]  Date:  02/27/2013   Name:  Gavin Fowler   DOB:  Jan 15, 1951   MRN:  [id number]  PCP:  Norberto Sorenson, MD    Chief Complaint: Abdominal Pain   History of Present Illness:  Gavin Fowler is a 62 y.o. very pleasant male patient who presents with the following:  History of diverticulitis.  Developed LLQ abdominal pain and bloating.  Hot at night with sweats but no documented fever.  No chills.  No stool change.  The patient has no complaint of blood, mucous, or pus in her stools. No nausea or vomiting. No GU symptoms.  No cough, coryza, wheezing or shortness of breath.  No improvement with over the counter medications or other home remedies. Denies other complaint or health concern today.  6-7 episodes of infection.  5 years since last bout.  Needs colonoscopy  Patient Active Problem List   Diagnosis Date Noted  . Diverticulitis of colon (without mention of hemorrhage) 02/27/2013  . Testicular pain 01/09/2013  . Erectile dysfunction 01/09/2013  . Family history of malignant neoplasm of gastrointestinal tract 01/09/2013  . Essential hypertension, benign 11/23/2012  . Other and unspecified hyperlipidemia 11/23/2012  . Hemorrhoid prolapse 09/28/2012    Past Medical History  Diagnosis Date  . Cancer   . GERD (gastroesophageal reflux disease)     Past Surgical History  Procedure Laterality Date  . Ethmoidectomy  1989  . Hernia repair  2007  . Prostate surgery  2011    History  Substance Use Topics  . Smoking status: Former Smoker    Quit date: 09/22/1990  . Smokeless tobacco: Not on file  . Alcohol Use: Yes    Family History  Problem Relation Age of Onset  . Colon cancer Father 72  . Breast cancer Sister     No Known Allergies  Medication list has been reviewed and updated.  Current Outpatient Prescriptions on File Prior to Visit  Medication Sig Dispense Refill  . PANTOPRAZOLE  SODIUM PO Take 40 mg by mouth daily.      . Pseudoephedrine HCl (SUDAFED PO) Take by mouth as needed.      Marland Kitchen amoxicillin (AMOXIL) 500 MG capsule Take 2 capsules (1,000 mg total) by mouth 3 (three) times daily.  60 capsule  0  . cephALEXin (KEFLEX) 500 MG capsule Take 1 capsule (500 mg total) by mouth 4 (four) times daily.  28 capsule  0  . fluticasone (FLONASE) 50 MCG/ACT nasal spray Place 2 sprays into the nose daily.  16 g  6  . sildenafil (VIAGRA) 100 MG tablet Take 1 tablet (100 mg total) by mouth as needed for erectile dysfunction.  30 tablet  3   No current facility-administered medications on file prior to visit.    Review of Systems:  As per HPI, otherwise negative.  Marland Kitchen   Physical Examination: Filed Vitals:   02/27/13 0818  BP: 122/100  Pulse: 86  Temp: 99 F (37.2 C)  Resp: 16   Filed Vitals:   02/27/13 0818  Height: 6\' 4"  (1.93 m)  Weight: 222 lb (100.699 kg)   Body mass index is 27.03 kg/(m^2). Ideal Body Weight: Weight in (lb) to have BMI = 25: 205  GEN: WDWN, NAD, Non-toxic, A & O x 3 HEENT: Atraumatic, Normocephalic. Neck supple. No masses, No LAD. Ears and Nose: No external deformity. CV: RRR, No M/G/R. No JVD. No thrill. No extra heart  sounds. PULM: CTA B, no wheezes, crackles, rhonchi. No retractions. No resp. distress. No accessory muscle use. ABD: S, marked abdominal tenderness mostly in LLQ, ND, +BS. No rebound. No HSM. EXTR: No c/c/e NEURO Normal gait.  PSYCH: Normally interactive. Conversant. Not depressed or anxious appearing.  Calm demeanor.    Assessment and Plan: Diverticulitis CBC  Signed,  Phillips Odor, MD   Results for orders placed in visit on 02/27/13  POCT CBC      Result Value Range   WBC 8.5  4.6 - 10.2 K/uL   Lymph, poc 1.3  0.6 - 3.4   POC LYMPH PERCENT 15.1.  10 - 50 %L   MID (cbc) 0.4  0 - 0.9   POC MID % 5.2  0 - 12 %M   POC Granulocyte 6.8  2 - 6.9   Granulocyte percent 79.7  37 - 80 %G   RBC 5.57  4.69 - 6.13  M/uL   Hemoglobin 17.2  14.1 - 18.1 g/dL   HCT, POC 40.9  81.1 - 53.7 %   MCV 94.7  80 - 97 fL   MCH, POC 30.9  27 - 31.2 pg   MCHC 32.6  31.8 - 35.4 g/dL   RDW, POC 91.4     Platelet Count, POC 284  142 - 424 K/uL   MPV 9.6  0 - 99.8 fL

## 2013-02-27 NOTE — Patient Instructions (Addendum)
Diverticulitis °A diverticulum is a small pouch or sac on the colon. Diverticulosis is the presence of these diverticula on the colon. Diverticulitis is the irritation (inflammation) or infection of diverticula. °CAUSES  °The colon and its diverticula contain bacteria. If food particles block the tiny opening to a diverticulum, the bacteria inside can grow and cause an increase in pressure. This leads to infection and inflammation and is called diverticulitis. °SYMPTOMS  °· Abdominal pain and tenderness. Usually, the pain is located on the left side of your abdomen. However, it could be located elsewhere. °· Fever. °· Bloating. °· Feeling sick to your stomach (nausea). °· Throwing up (vomiting). °· Abnormal stools. °DIAGNOSIS  °Your caregiver will take a history and perform a physical exam. Since many things can cause abdominal pain, other tests may be necessary. Tests may include: °· Blood tests. °· Urine tests. °· X-ray of the abdomen. °· CT scan of the abdomen. °Sometimes, surgery is needed to determine if diverticulitis or other conditions are causing your symptoms. °TREATMENT  °Most of the time, you can be treated without surgery. Treatment includes: °· Resting the bowels by only having liquids for a few days. As you improve, you will need to eat a low-fiber diet. °· Intravenous (IV) fluids if you are losing body fluids (dehydrated). °· Antibiotic medicines that treat infections may be given. °· Pain and nausea medicine, if needed. °· Surgery if the inflamed diverticulum has burst. °HOME CARE INSTRUCTIONS  °· Try a clear liquid diet (broth, tea, or water for as long as directed by your caregiver). You may then gradually begin a low-fiber diet as tolerated.  °A low-fiber diet is a diet with less than 10 grams of fiber. Choose the foods below to reduce fiber in the diet: °· White breads, cereals, rice, and pasta. °· Cooked fruits and vegetables or soft fresh fruits and vegetables without the skin. °· Ground or  well-cooked tender beef, ham, veal, lamb, pork, or poultry. °· Eggs and seafood. °· After your diverticulitis symptoms have improved, your caregiver may put you on a high-fiber diet. A high-fiber diet includes 14 grams of fiber for every 1000 calories consumed. For a standard 2000 calorie diet, you would need 28 grams of fiber. Follow these diet guidelines to help you increase the fiber in your diet. It is important to slowly increase the amount fiber in your diet to avoid gas, constipation, and bloating. °· Choose whole-grain breads, cereals, pasta, and brown rice. °· Choose fresh fruits and vegetables with the skin on. Do not overcook vegetables because the more vegetables are cooked, the more fiber is lost. °· Choose more nuts, seeds, legumes, dried peas, beans, and lentils. °· Look for food products that have greater than 3 grams of fiber per serving on the Nutrition Facts label. °· Take all medicine as directed by your caregiver. °· If your caregiver has given you a follow-up appointment, it is very important that you go. Not going could result in lasting (chronic) or permanent injury, pain, and disability. If there is any problem keeping the appointment, call to reschedule. °SEEK MEDICAL CARE IF:  °· Your pain does not improve. °· You have a hard time advancing your diet beyond clear liquids. °· Your bowel movements do not return to normal. °SEEK IMMEDIATE MEDICAL CARE IF:  °· Your pain becomes worse. °· You have an oral temperature above 102° F (38.9° C), not controlled by medicine. °· You have repeated vomiting. °· You have bloody or black, tarry stools. °·   Symptoms that brought you to your caregiver become worse or are not getting better. °MAKE SURE YOU:  °· Understand these instructions. °· Will watch your condition. °· Will get help right away if you are not doing well or get worse. °Document Released: 06/18/2005 Document Revised: 12/01/2011 Document Reviewed: 10/14/2010 °[employer]® Patient Information  ©2014 [employer]. ° °

## 2013-07-28 ENCOUNTER — Other Ambulatory Visit: Payer: Self-pay

## 2013-10-23 ENCOUNTER — Ambulatory Visit (INDEPENDENT_AMBULATORY_CARE_PROVIDER_SITE_OTHER): Payer: BC Managed Care – PPO | Admitting: Emergency Medicine

## 2013-10-23 ENCOUNTER — Ambulatory Visit: Payer: BC Managed Care – PPO

## 2013-10-23 VITALS — BP 118/86 | HR 72 | Temp 98.3°F | Resp 16 | Ht 76.0 in | Wt 213.0 lb

## 2013-10-23 DIAGNOSIS — R0981 Nasal congestion: Secondary | ICD-10-CM

## 2013-10-23 DIAGNOSIS — J3489 Other specified disorders of nose and nasal sinuses: Secondary | ICD-10-CM

## 2013-10-23 MED ORDER — AMOXICILLIN-POT CLAVULANATE 875-125 MG PO TABS: 1.0000 | ORAL_TABLET | Freq: Two times a day (BID) | ORAL | Status: DC

## 2013-10-23 NOTE — Progress Notes (Signed)
   Subjective:    Patient ID: Gavin Fowler, male    DOB: 08-Aug-1951, 63 y.o.   MRN: [id number]  HPI This chart was scribed for Gavin ChrisSteven Ilynn Stauffer, MD by Andrew Auaven Small, ED Scribe. This patient was seen in room 13 and the patient's care was started at 10:15 AM.  HPI Comments: Gavin Fowler is a 63 y.o. male who presents to the [facility] complaining of intermittent sinus pressure onset 1 month. He reports that he takes sudafed with minor relief. He reports that for the past 2-3 days his symptoms have worsened . He reports teeth pain. He reports that he has a root canal. He states that the pain is so bad he can not sleep at night. He reports the majority of the sinus pressure is on the left. He denies sore throat and cough.  He reports that he has history of sinus problem and had an ethmoidectomy. Pt is a prostate cancer survivor.    Past Medical History  Diagnosis Date  . Cancer   . GERD (gastroesophageal reflux disease)    No Known Allergies Prior to Admission medications   Medication Sig Start Date End Date Taking? Authorizing Provider  PANTOPRAZOLE SODIUM PO Take 40 mg by mouth daily.   Yes Historical Provider, MD  Pseudoephedrine HCl (SUDAFED PO) Take by mouth as needed.   Yes Historical Provider, MD  amoxicillin (AMOXIL) 500 MG capsule Take 2 capsules (1,000 mg total) by mouth 3 (three) times daily. 10/06/12   Sherren MochaEva N Shaw, MD  cephALEXin (KEFLEX) 500 MG capsule Take 1 capsule (500 mg total) by mouth 4 (four) times daily. 11/23/12   Sherren MochaEva N Shaw, MD  ciprofloxacin (CIPRO) 500 MG tablet Take 1 tablet (500 mg total) by mouth 2 (two) times daily. 02/27/13   Phillips OdorJeffery Anderson, MD  fluticasone (FLONASE) 50 MCG/ACT nasal spray Place 2 sprays into the nose daily. 10/06/12   Sherren MochaEva N Shaw, MD  metroNIDAZOLE (FLAGYL) 500 MG tablet Take 1 tablet (500 mg total) by mouth 4 (four) times daily. DO NOT CONSUME ALCOHOL WHILE TAKING THIS MEDICATION. 02/27/13   Phillips OdorJeffery Anderson, MD  sildenafil (VIAGRA)  100 MG tablet Take 1 tablet (100 mg total) by mouth as needed for erectile dysfunction. 10/06/12   Sherren MochaEva N Shaw, MD    Review of Systems  HENT: Positive for sinus pressure. Negative for sore throat.        Teeth pain  Respiratory: Negative for cough.        Objective:   Physical Exam CONSTITUTIONAL: Well developed/well nourished HEAD: Normocephalic/atraumatic EYES: EOMI/PERRL ENMT: Mucous membranes moist NECK: supple no meningeal signs SPINE:entire spine nontender CV: no murmurs/rubs/gallops noted LUNGS: Lungs are clear to auscultation bilaterally, no apparent distress ABDOMEN: soft, nontender, no rebound or guarding GU:no cva tenderness NEURO: Pt is awake/alert, moves all extremitiesx4 EXTREMITIES: pulses normal, full ROM SKIN: warm, color normal PSYCH: no abnormalities of mood noted [facility] reading (PRIMARY) by  Dr.Creg Gilmer patient has an occluded left maxillary sinus.  Meds ordered this encounter  Medications  . amoxicillin-clavulanate (AUGMENTIN) 875-125 MG per tablet    Sig: Take 1 tablet by mouth 2 (two) times daily.    Dispense:  28 tablet    Refill:  0         Assessment & Plan:   Patient has a significant left maxillary sinusitis will treat with Augmentin for 2 weeks.

## 2013-10-23 NOTE — Patient Instructions (Signed)

## 2013-11-11 ENCOUNTER — Ambulatory Visit (INDEPENDENT_AMBULATORY_CARE_PROVIDER_SITE_OTHER): Payer: BC Managed Care – PPO | Admitting: Family Medicine

## 2013-11-11 ENCOUNTER — Encounter: Payer: Self-pay | Admitting: Family Medicine

## 2013-11-11 VITALS — BP 132/84 | HR 98 | Temp 99.0°F | Resp 16 | Ht 76.0 in | Wt 209.4 lb

## 2013-11-11 DIAGNOSIS — N509 Disorder of male genital organs, unspecified: Secondary | ICD-10-CM

## 2013-11-11 DIAGNOSIS — J3489 Other specified disorders of nose and nasal sinuses: Secondary | ICD-10-CM

## 2013-11-11 DIAGNOSIS — N50819 Testicular pain, unspecified: Secondary | ICD-10-CM

## 2013-11-11 DIAGNOSIS — R0981 Nasal congestion: Secondary | ICD-10-CM

## 2013-11-11 DIAGNOSIS — Z8546 Personal history of malignant neoplasm of prostate: Secondary | ICD-10-CM

## 2013-11-11 MED ORDER — FLUTICASONE PROPIONATE 50 MCG/ACT NA SUSP: 2.0000 | Freq: Every day | NASAL | Status: DC

## 2013-11-11 MED ORDER — TRAMADOL HCL 50 MG PO TABS: 50.0000 mg | ORAL_TABLET | Freq: Three times a day (TID) | ORAL | Status: DC | PRN

## 2013-11-11 NOTE — Progress Notes (Signed)
Chief Complaint:  Chief Complaint  Patient presents with  . Testicle Pain    right, per patient had prostate surgery 07/2010    HPI: Gavin Fowler is a 63 y.o. male who is here for: 1. Sinus congestions, not resolved. He has been on augmentin with some relief. NO major difference, ? Allergies. .  2. He also has chronic right testicular pain since his prostate surgery. Normally 3-4/10 just sitting. Sometimes it is up to a 7/10 , sitting, no movement is necessary to make hurt.  HE has been to numerous urologist. Denies fevers or chills. The urologist have put on abx for epididymitis, no releif with this. HE took a tramadol from his wife and that seemed to help. He wants the paint o go away. This is a man who has ahd prostate cancer and ahs had complications from his prostatectomy since 2 years ago. Please see Dr Raul Del in depth HPIs from prior 2 visits.    OV from Dr Brigitte Pulse on 11/2012  Still has to use the penis pump to ever achieve an erection that is sufficient for penetration. He feels that the new bend in the penile shaft that he gets with erection is starting to get worse and the subcutaneous nodules where it bends are starting to become bigger. The nodules developed after he gave himself an injectable med for an erection and developed priapism so then penis had to be injected with phenylephrine in the ER - he thinks likely scar tissue from the injections/medication.  Thinks that the cremasteric muscle might have been unintentionally pulled into surgical repair of inguinal hernia on right. His right testicle continues to be elevated at all times. He can't get it to relax even when the other one does. It is almost constantly painful but much more so when he uses the penis pump as then the testicle gets pinched - is in the way. Also after the repair, his epididymis was almost in the front of his testicle so VERY painful when compressed by the penis pump but through PT it is gradually  starting to move medially and posterior again.  Is continuing to do physical therapy at [facility] - more to get an erection than to fix testicular pain - but has also started seeing [name] - with [name] - who has tried to help with nodules and [place] to see if she can get tissue to relax. Pt was initially followed by Dr. Alinda Money post-op at [facility] but he is still having so many problems, he went to [facility] for a second opinion but didn't seem to have much to offer him. VERY frustrated and upset that he has been struggling with this for so long.  Viagra doesn't help at all - has tried up to 5m at a time. His wife is scared about hormone treatment, the possibility that testosterone supp could possibly encourage return of prostate cancer - though PSA undetectable and s/p complete prostatectomy. At last visit, I gave him literature on the latest studies showing that testosterone replacement has been used successfully in men with a h/o prostate cancer s/p complete prostatectomy that did very well and no sig difference in recurrance of cancer was noted as long as PSA was undetectable. However, this is still an evolving field and so certainly not "standard" of current use. Gavin Fowler is encouraging them to discuss alternative ways to have intimacy rather than penetration, which is fine w/ pt,  but he really doesn't want to give up penetration/intercourse for the rest of his life as prior, his wife and him had a VERY healthy sex life. Has 8 kids! Dorna Leitz - 6 girls and 2 boys. About 3 are still living at home.   OV vist from Dr Brigitte Pulse 10/15/2012  HPI Gavin Fowler is a delightful 63 yo man who is s/p complete prostatectomy 2/2 prostate cancer 2 yrs previously with repair of a small right inguinal hernia by suture closure during the surgery. Unfortunately, since his surgery he has suffered from severe incontinence - which is still slowly improving and  complete lack of erectile function. He has tried viagra w/o help - if he takes a $Remov'50mg'lPuzWl$  he can somewhat get an erection - has not tried 100 mg - but his urologist told him that the phosphodiesterase inhibitors were unlikely to help as he had excellent blood flow to his penis. He tried injectable trimix which caused severe priapism resulting in an ER visit and almost had a stroke resulting in a migraine HA for 4d after an overdose of a pseudoephedrine shot - also humiliating as the physician told a young male assistant to massage his penis to help it relax and insinuated that he was being inappropriate when he stated that he would prefer his wife to do that. The only thing that he found has worked is a ring that goes around the base of the scrotum and the penis and tightens it - holding blood in the erect penis - used in combo with viagra or the penis vacuum pump. Unfortunately, the vacuum pump is very painful to use as he is unable to move his right testicle out of the way since the inguinal hernia repair. His right testicle is constantly elevated, has lost its cremasteric reflex, and is now almost constantly painful since the hernia repair. The hernia repair was done by a different surgeon than that who did his robotic prostatectomy though they were done at the same time. He has considered having a penile implant placed but that is his last resort as it is so permanent and there could be complications with even this easy surgery - as he has already had complications with others. His urologist was at [facility] in [place] but he did go for a second opinion when he has had complications to [facility] office - that urologist diagnosed him with a testicular infection after a painful testicular exam and so completed a course of antibiotics w/o any improvement. His prior PCP tried him on some meloxicam for the inflammation which did help the pain some while he was on it but as soon as he stopped  taking it, the testicular pain returned in full. He does have an appt with another urologist - Dr. Royce Macadamia - at [facility] office in several days. In addition, he is still seeing a PT for these concerns - Ileana Roup. Fortunately, his PSA in the past 2 yrs has continued to be undetectable with the exception of one lab draw several mos ago which is assumed ot be an error as repeat testing came back as 0 again. He recently obtained his medical records from [facility] for his second opinion and to transfer his PCP here and read through them - he did note that in many physical exams he was listed as circumcised, when indeed he is not, and many physicians had signed off on notes who he has never met.  Gavin Fowler  has not yet tried the viagra $RemoveBe'100mg'PRyMsFvlA$  that I prescribed at last OV due to expense.  He came in this morning for fasting qam labs ordered below.  Certainly this whole situation has affected his mood. He is not depressed but to find he has cancer and then have so many complications from the surgery and from repair of something that wasn't even bothering him has certainly taken a toll on his mental health and he does occasional have episodes of being sad and down over his condition and his changed romantic relationship with his wife. Between multiple surgeries, exams, physician and PT interventions, he has lost all privacy and inhibition with discussing and treating his condition.  He had never had his testosterone checked before as he figured even if he had low testosterone, there was nothing that could be done about it since he has a h/o prostate cancer. However, since we discussed that it was at least worth checking at our last visit and it did return low, he read online about people's great experience with testosterone replacement even after total prostatectomy for prostate cancer - there was a prostate cancer forum from the [facility] that brought him great hope to hear other  individual's stories about how much better they felt after T-replacement.     Past Medical History  Diagnosis Date  . Cancer   . GERD (gastroesophageal reflux disease)    Past Surgical History  Procedure Laterality Date  . Ethmoidectomy  1989  . Hernia repair  2007  . Prostate surgery  2011   History   Social History  . Marital Status: Married    Spouse Name: N/A    Number of Children: N/A  . Years of Education: N/A   Social History Main Topics  . Smoking status: Former Smoker    Quit date: 09/22/1990  . Smokeless tobacco: None  . Alcohol Use: Yes  . Drug Use: No  . Sexual Activity: None   Other Topics Concern  . None   Social History Narrative  . None   Family History  Problem Relation Age of Onset  . Colon cancer Father 6  . Breast cancer Sister    No Known Allergies Prior to Admission medications   Medication Sig Start Date End Date Taking? Authorizing Provider  PANTOPRAZOLE SODIUM PO Take 40 mg by mouth daily.   Yes Historical Provider, MD  amoxicillin (AMOXIL) 500 MG capsule Take 2 capsules (1,000 mg total) by mouth 3 (three) times daily. 10/06/12   Shawnee Knapp, MD  amoxicillin-clavulanate (AUGMENTIN) 875-125 MG per tablet Take 1 tablet by mouth 2 (two) times daily. 10/23/13   Darlyne Russian, MD  cephALEXin (KEFLEX) 500 MG capsule Take 1 capsule (500 mg total) by mouth 4 (four) times daily. 11/23/12   Shawnee Knapp, MD  ciprofloxacin (CIPRO) 500 MG tablet Take 1 tablet (500 mg total) by mouth 2 (two) times daily. 02/27/13   Ellison Carwin, MD  fluticasone (FLONASE) 50 MCG/ACT nasal spray Place 2 sprays into the nose daily. 10/06/12   Shawnee Knapp, MD  metroNIDAZOLE (FLAGYL) 500 MG tablet Take 1 tablet (500 mg total) by mouth 4 (four) times daily. DO NOT CONSUME ALCOHOL WHILE TAKING THIS MEDICATION. 02/27/13   Ellison Carwin, MD  Pseudoephedrine HCl (SUDAFED PO) Take by mouth as needed.    Historical Provider, MD  sildenafil (VIAGRA) 100 MG tablet Take 1 tablet (100 mg  total) by mouth as needed for erectile dysfunction. 10/06/12   Laurey Arrow  Brigitte Pulse, MD     ROS: The patient denies fevers, chills, night sweats, unintentional weight loss, chest pain, palpitations, wheezing, dyspnea on exertion, nausea, vomiting, abdominal pain, melena, numbness, weakness, or tingling.   All other systems have been reviewed and were otherwise negative with the exception of those mentioned in the HPI and as above.    PHYSICAL EXAM: Filed Vitals:   11/11/13 1618  BP: 132/84  Pulse: 98  Temp: 99 F (37.2 C)  Resp: 16   Filed Vitals:   11/11/13 1618  Height: _0  (1.93 m)  Weight: 209 lb 6.4 oz (94.983 kg)   Body mass index is 25.5 kg/(m^2).  General: Alert, no acute distress HEENT:  Normocephalic, atraumatic, oropharynx patent. EOMI, PERRLA, + min sinus tenderness, TM nl, no exudates Cardiovascular:  Regular rate and rhythm, no rubs murmurs or gallops.  No Carotid bruits, radial pulse intact. No pedal edema.  Respiratory: Clear to auscultation bilaterally.  No wheezes, rales, or rhonchi.  No cyanosis, no use of accessory musculature GI: No organomegaly, abdomen is soft and non-tender, positive bowel sounds.  No masses. Skin: No rashes. Neurologic: Facial musculature symmetric. Psychiatric: Patient is appropriate throughout our interaction. Lymphatic: No cervical lymphadenopathy Musculoskeletal: Gait intact. Right testicle severely elevated,  + Tenderness, no erythema or warmth.    LABS: Results for orders placed in visit on 02/27/13  POCT CBC      Result Value Ref Range   WBC 8.5  4.6 - 10.2 K/uL   Lymph, poc 1.3  0.6 - 3.4   POC LYMPH PERCENT 15.1.  10 - 50 %L   MID (cbc) 0.4  0 - 0.9   POC MID % 5.2  0 - 12 %M   POC Granulocyte 6.8  2 - 6.9   Granulocyte percent 79.7  37 - 80 %G   RBC 5.57  4.69 - 6.13 M/uL   Hemoglobin 17.2  14.1 - 18.1 g/dL   HCT, POC 52.7  43.5 - 53.7 %   MCV 94.7  80 - 97 fL   MCH, POC 30.9  27 - 31.2 pg   MCHC 32.6  31.8 - 35.4 g/dL    RDW, POC 14.9     Platelet Count, POC 284  142 - 424 K/uL   MPV 9.6  0 - 99.8 fL     EKG/XRAY:   Primary read interpreted by Dr. Marin Comment at [facility].   ASSESSMENT/PLAN: Encounter Diagnoses  Name Primary?  . Sinus congestion Yes  . History of prostate cancer   . Testicular pain    RX flonase for ? Allergies, allergic rhinitis Rx tramadol which helped with his testicular pain I will try to contact some osteopathic urologist around the country and see if there is anything else that they would consider trying holistically. I realize he has had complications from his prostatectomy but he has seen numerous urologists for this issue, I am not optimistic there are other options besides what ahs already been discussed with him. I will also contact the [place] [employer] to see if there is a DO urologist in the state since [place] desires that.  F/u prn  Gross sideeffects, risk and benefits, and alternatives of medications d/w patient. Patient is aware that all medications have potential sideeffects and we are unable to predict every sideeffect or drug-drug interaction that may occur.  Tanaja Ganger, [place], DO 11/11/2013 5:23 PM

## 2013-11-23 ENCOUNTER — Telehealth: Payer: Self-pay | Admitting: *Deleted

## 2013-11-23 NOTE — Telephone Encounter (Signed)
Message copied by Blima LedgerICHARDSON, Kallyn Demarcus D on Wed Nov 23, 2013  8:24 PM ------      Message from: Hamilton CapriLE, THAO P      Created: Wed Nov 23, 2013  5:40 PM       Please let him know I have not forgotten about him .      I have asked some of the DOs in charge of some of the university urology programs around the country  to see what they are doing or would suggest. Have not heard anything. Will keep him posted. How is the tramadol working?             Thanks      Tle ------

## 2013-11-24 NOTE — Telephone Encounter (Signed)
Left detailed message on machine.

## 2013-11-24 NOTE — Telephone Encounter (Signed)
Spoke to patient medication is helping with both pain issues. Will be seeing us for his PE on the 20th .

## 2013-11-25 NOTE — Telephone Encounter (Signed)
Spoke to pt yesterday, he will await your call.

## 2013-12-09 ENCOUNTER — Encounter: Payer: Self-pay | Admitting: Family Medicine

## 2013-12-09 ENCOUNTER — Ambulatory Visit (INDEPENDENT_AMBULATORY_CARE_PROVIDER_SITE_OTHER): Payer: BC Managed Care – PPO | Admitting: Family Medicine

## 2013-12-09 VITALS — BP 140/80 | HR 76 | Temp 98.2°F | Resp 16 | Ht 76.5 in | Wt 209.2 lb

## 2013-12-09 DIAGNOSIS — K649 Unspecified hemorrhoids: Secondary | ICD-10-CM

## 2013-12-09 DIAGNOSIS — Z8546 Personal history of malignant neoplasm of prostate: Secondary | ICD-10-CM

## 2013-12-09 DIAGNOSIS — Z13220 Encounter for screening for lipoid disorders: Secondary | ICD-10-CM

## 2013-12-09 DIAGNOSIS — Z0000 Encounter for general adult medical examination without abnormal findings: Secondary | ICD-10-CM

## 2013-12-09 LAB — COMPREHENSIVE METABOLIC PANEL WITH GFR
ALT: 20 U/L (ref 0–53)
Albumin: 4.5 g/dL (ref 3.5–5.2)
CO2: 29 meq/L (ref 19–32)
Glucose, Bld: 108 mg/dL — ABNORMAL HIGH (ref 70–99)
Potassium: 3.8 meq/L (ref 3.5–5.3)
Sodium: 141 meq/L (ref 135–145)
Total Protein: 6.8 g/dL (ref 6.0–8.3)

## 2013-12-09 LAB — COMPREHENSIVE METABOLIC PANEL
AST: 18 U/L (ref 0–37)
Alkaline Phosphatase: 73 U/L (ref 39–117)
BUN: 12 mg/dL (ref 6–23)
Calcium: 9.2 mg/dL (ref 8.4–10.5)
Chloride: 101 mEq/L (ref 96–112)
Creat: 0.86 mg/dL (ref 0.50–1.35)
Total Bilirubin: 0.9 mg/dL (ref 0.2–1.2)

## 2013-12-09 LAB — CBC
HCT: 47.6 % (ref 39.0–52.0)
Hemoglobin: 16.8 g/dL (ref 13.0–17.0)
MCH: 30.6 pg (ref 26.0–34.0)
MCHC: 35.3 g/dL (ref 30.0–36.0)
MCV: 86.7 fL (ref 78.0–100.0)
Platelets: 281 10*3/uL (ref 150–400)
RBC: 5.49 MIL/uL (ref 4.22–5.81)
RDW: 14.5 % (ref 11.5–15.5)
WBC: 5.8 10*3/uL (ref 4.0–10.5)

## 2013-12-09 LAB — POCT URINALYSIS DIPSTICK
Bilirubin, UA: NEGATIVE
Blood, UA: NEGATIVE
Glucose, UA: NEGATIVE
Ketones, UA: NEGATIVE
Leukocytes, UA: NEGATIVE
Nitrite, UA: NEGATIVE
Protein, UA: NEGATIVE
Spec Grav, UA: >=1.03
Urobilinogen, UA: 0.2
pH, UA: 5.5

## 2013-12-09 LAB — POCT UA - MICROSCOPIC ONLY
Bacteria, U Microscopic: NEGATIVE
Casts, Ur, LPF, POC: NEGATIVE
Crystals, Ur, HPF, POC: NEGATIVE
Epithelial cells, urine per micros: NEGATIVE
Mucus, UA: NEGATIVE
RBC, urine, microscopic: NEGATIVE
Yeast, UA: NEGATIVE

## 2013-12-09 LAB — LIPID PANEL
Cholesterol: 215 mg/dL — ABNORMAL HIGH (ref 0–200)
HDL: 66 mg/dL (ref >39)
LDL Cholesterol: 128 mg/dL — ABNORMAL HIGH (ref 0–99)
Total CHOL/HDL Ratio: 3.3 Ratio
Triglycerides: 103 mg/dL (ref <150)
VLDL: 21 mg/dL (ref 0–40)

## 2013-12-09 MED ORDER — LIDOCAINE 5 % EX OINT: 1.0000 "application " | TOPICAL_OINTMENT | CUTANEOUS | Status: DC | PRN

## 2013-12-09 MED ORDER — DILTIAZEM GEL 2 %: 1.0000 "application " | Freq: Two times a day (BID) | CUTANEOUS | Status: DC | PRN

## 2013-12-09 MED ORDER — HYDROCORTISONE ACETATE 25 MG RE SUPP: 25.0000 mg | Freq: Two times a day (BID) | RECTAL | Status: DC | PRN

## 2013-12-09 MED ORDER — HYDROCORTISONE 2.5 % RE CREA: 1.0000 "application " | TOPICAL_CREAM | Freq: Two times a day (BID) | RECTAL | Status: DC

## 2013-12-09 NOTE — Progress Notes (Signed)
Chief Complaint:  Chief Complaint  Patient presents with  . Annual Exam    HPI: Gavin Fowler is a 63 y.o. male who is here for CPE.  H/o prostate cancer s/p prostectomy, nerve sparing procedure, had complications still has testicular pain and urinary incontinence and severe ED issues, he cannot have normal or close to normal ejaculations and it is more like a muscle spasm/jerk then it stops and he does not produce any ejaculate. He has constant pain at the base of his right testicle which has been treated multiple times with anitbiotics for  epididyimitis without resolution or improvement. He has been takingtramadol and that makes it tolerable, 1-2 times per week. The pain hurts him when he tries to use his penis pump or the cock ring to get an erection. He states his libido is good.  H/o sinusitis, flonase helps some, he still has some mild tenderness Tramadol help with penile pain UTD on colonscopy, just did it this summer 03/2013--normal Has hemorrhoids and usually able to push it back in but not today, does do sitz bths  Past Medical History  Diagnosis Date  . Cancer   . GERD (gastroesophageal reflux disease)   . Cataract    Past Surgical History  Procedure Laterality Date  . Ethmoidectomy  1989  . Hernia repair  2007  . Prostate surgery  2011  . Eye surgery     History   Social History  . Marital Status: Married    Spouse Name: N/A    Number of Children: N/A  . Years of Education: N/A   Occupational History  . attorney    Social History Main Topics  . Smoking status: Former Smoker    Quit date: 09/22/1990  . Smokeless tobacco: None  . Alcohol Use: Yes     Comment: 5-6 drinks/week  . Drug Use: No  . Sexual Activity: None   Other Topics Concern  . None   Social History Narrative  . None   Family History  Problem Relation Age of Onset  . Colon cancer Father [id number]  . Breast cancer Sister    No Known Allergies Prior to Admission medications     Medication Sig Start Date End Date Taking? Authorizing Provider  fluticasone (FLONASE) 50 MCG/ACT nasal spray Place 2 sprays into both nostrils daily. 11/11/13  Yes Kindle Strohmeier P Issabelle Mcraney, DO  PANTOPRAZOLE SODIUM PO Take 40 mg by mouth daily.   Yes Historical Provider, MD  traMADol (ULTRAM) 50 MG tablet Take 1 tablet (50 mg total) by mouth every 8 (eight) hours as needed. 11/11/13  Yes Salayah Meares P Tabb Croghan, DO  cephALEXin (KEFLEX) 500 MG capsule Take 1 capsule (500 mg total) by mouth 4 (four) times daily. 11/23/12   Sherren MochaEva N Shaw, MD  ciprofloxacin (CIPRO) 500 MG tablet Take 1 tablet (500 mg total) by mouth 2 (two) times daily. 02/27/13   Phillips OdorJeffery Anderson, MD  metroNIDAZOLE (FLAGYL) 500 MG tablet Take 1 tablet (500 mg total) by mouth 4 (four) times daily. DO NOT CONSUME ALCOHOL WHILE TAKING THIS MEDICATION. 02/27/13   Phillips OdorJeffery Anderson, MD  Pseudoephedrine HCl (SUDAFED PO) Take by mouth as needed.    Historical Provider, MD  sildenafil (VIAGRA) 100 MG tablet Take 1 tablet (100 mg total) by mouth as needed for erectile dysfunction. 10/06/12   Sherren MochaEva N Shaw, MD     ROS: The patient denies fevers, chills, night sweats, unintentional weight loss, chest pain, palpitations, wheezing, dyspnea on exertion, nausea, vomiting,  abdominal pain, dysuria, hematuria, melena, numbness, weakness, or tingling.   All other systems have been reviewed and were otherwise negative with the exception of those mentioned in the HPI and as above.    PHYSICAL EXAM: Filed Vitals:   12/09/13 1129  BP: 140/80  Pulse: 76  Temp: 98.2 F (36.8 C)  Resp: 16   Filed Vitals:   12/09/13 1129  Height: 6' 4.5" (1.943 m)  Weight: 209 lb 3.2 oz (94.892 kg)   Body mass index is 25.14 kg/(m^2).  General: Alert, no acute distress HEENT:  Normocephalic, atraumatic, oropharynx patent. EOMI, PERRLA, tm nl, no exudates Cardiovascular:  Regular rate and rhythm, no rubs murmurs or gallops.  No Carotid bruits, radial pulse intact. No pedal edema.  Respiratory: Clear to  auscultation bilaterally.  No wheezes, rales, or rhonchi.  No cyanosis, no use of accessory musculature GI: No organomegaly, abdomen is soft and non-tender, positive bowel sounds.  No masses. Skin: No rashes. Neurologic: Facial musculature symmetric. Psychiatric: Patient is appropriate throughout our interaction. Lymphatic: No cervical lymphadenopathy Musculoskeletal: Gait intact. Uncircumcised, + right base testicular pain, nodule wthat has been chronic + hemorrhoid at 6 oclock, not thombosed yet but it is large.  I deferred the prstate ecam and rectal exam-he has had prostate removal and his colonscopy this summer was normal and he ahs a large irritated hemorhoid today   LABS: Results for orders placed in visit on 12/09/13  CBC      Result Value Ref Range   WBC 5.8  4.0 - 10.5 K/uL   RBC 5.49  4.22 - 5.81 MIL/uL   Hemoglobin 16.8  13.0 - 17.0 g/dL   HCT 57.8  46.9 - 62.9 %   MCV 86.7  78.0 - 100.0 fL   MCH 30.6  26.0 - 34.0 pg   MCHC 35.3  30.0 - 36.0 g/dL   RDW 52.8  41.3 - 24.4 %   Platelets 281  150 - 400 K/uL  COMPREHENSIVE METABOLIC PANEL      Result Value Ref Range   Sodium 141  135 - 145 mEq/L   Potassium 3.8  3.5 - 5.3 mEq/L   Chloride 101  96 - 112 mEq/L   CO2 29  19 - 32 mEq/L   Glucose, Bld 108 (*) 70 - 99 mg/dL   BUN 12  6 - 23 mg/dL   Creat 0.10  2.72 - 5.36 mg/dL   Total Bilirubin 0.9  0.2 - 1.2 mg/dL   Alkaline Phosphatase 73  39 - 117 U/L   AST 18  0 - 37 U/L   ALT 20  0 - 53 U/L   Total Protein 6.8  6.0 - 8.3 g/dL   Albumin 4.5  3.5 - 5.2 g/dL   Calcium 9.2  8.4 - 64.4 mg/dL  PSA      Result Value Ref Range   PSA <0.01  <=4.00 ng/mL  LIPID PANEL      Result Value Ref Range   Cholesterol 215 (*) 0 - 200 mg/dL   Triglycerides 034  <742 mg/dL   HDL 66  >59 mg/dL   Total CHOL/HDL Ratio 3.3     VLDL 21  0 - 40 mg/dL   LDL Cholesterol 563 (*) 0 - 99 mg/dL  TSH      Result Value Ref Range   TSH 1.313  0.350 - 4.500 uIU/mL  POCT UA - MICROSCOPIC ONLY        Result Value Ref Range  WBC, Ur, HPF, POC 0-3     RBC, urine, microscopic neg     Bacteria, U Microscopic neg     Mucus, UA neg     Epithelial cells, urine per micros neg     Crystals, Ur, HPF, POC neg     Casts, Ur, LPF, POC neg     Yeast, UA neg    POCT URINALYSIS DIPSTICK      Result Value Ref Range   Color, UA yellow     Clarity, UA clear     Glucose, UA neg     Bilirubin, UA neg     Ketones, UA neg     Spec Grav, UA >=1.030     Blood, UA neg     pH, UA 5.5     Protein, UA neg     Urobilinogen, UA 0.2     Nitrite, UA neg     Leukocytes, UA Negative       EKG/XRAY:   Primary read interpreted by Dr. Conley Rolls at [facility].   ASSESSMENT/PLAN: Encounter Diagnoses  Name Primary?  . Annual physical exam Yes  . History of prostate cancer   . Screening for hyperlipidemia   . Hemorrhoids    Annual labs Rx anusol, diltazem gel, lidocaine F/u prn otherwise in 1 month If he needs tramadol then may refills No hemosure since large hemorrhoid, non embolic, recent colonscopy was negative  Gross sideeffects, risk and benefits, and alternatives of medications d/w patient. Patient is aware that all medications have potential sideeffects and we are unable to predict every sideeffect or drug-drug interaction that may occur.  Dailee Manalang PHUONG, DO 12/11/2013 2:42 AM

## 2013-12-09 NOTE — Patient Instructions (Signed)
Hemorrhoids Hemorrhoids are swollen veins around the rectum or anus. There are two types of hemorrhoids:   Internal hemorrhoids. These occur in the veins just inside the rectum. They may poke through to the outside and become irritated and painful.  External hemorrhoids. These occur in the veins outside the anus and can be felt as a painful swelling or hard lump near the anus. CAUSES  Pregnancy.   Obesity.   Constipation or diarrhea.   Straining to have a bowel movement.   Sitting for long periods on the toilet.  Heavy lifting or other activity that caused you to strain.  Anal intercourse. SYMPTOMS   Pain.   Anal itching or irritation.   Rectal bleeding.   Fecal leakage.   Anal swelling.   One or more lumps around the anus.  DIAGNOSIS  Your caregiver may be able to diagnose hemorrhoids by visual examination. Other examinations or tests that may be performed include:   Examination of the rectal area with a gloved hand (digital rectal exam).   Examination of anal canal using a small tube (scope).   A blood test if you have lost a significant amount of blood.  A test to look inside the colon (sigmoidoscopy or colonoscopy). TREATMENT Most hemorrhoids can be treated at home. However, if symptoms do not seem to be getting better or if you have a lot of rectal bleeding, your caregiver may perform a procedure to help make the hemorrhoids get smaller or remove them completely. Possible treatments include:   Placing a rubber band at the base of the hemorrhoid to cut off the circulation (rubber band ligation).   Injecting a chemical to shrink the hemorrhoid (sclerotherapy).   Using a tool to burn the hemorrhoid (infrared light therapy).   Surgically removing the hemorrhoid (hemorrhoidectomy).   Stapling the hemorrhoid to block blood flow to the tissue (hemorrhoid stapling).  HOME CARE INSTRUCTIONS   Eat foods with fiber, such as whole grains, beans,  nuts, fruits, and vegetables. Ask your doctor about taking products with added fiber in them (fibersupplements).  Increase fluid intake. Drink enough water and fluids to keep your urine clear or pale yellow.   Exercise regularly.   Go to the bathroom when you have the urge to have a bowel movement. Do not wait.   Avoid straining to have bowel movements.   Keep the anal area dry and clean. Use wet toilet paper or moist towelettes after a bowel movement.   Medicated creams and suppositories may be used or applied as directed.   Only take over-the-counter or prescription medicines as directed by your caregiver.   Take warm sitz baths for 15 20 minutes, 3 4 times a day to ease pain and discomfort.   Place ice packs on the hemorrhoids if they are tender and swollen. Using ice packs between sitz baths may be helpful.   Put ice in a plastic bag.   Place a towel between your skin and the bag.   Leave the ice on for 15 20 minutes, 3 4 times a day.   Do not use a donut-shaped pillow or sit on the toilet for long periods. This increases blood pooling and pain.  SEEK MEDICAL CARE IF:  You have increasing pain and swelling that is not controlled by treatment or medicine.  You have uncontrolled bleeding.  You have difficulty or you are unable to have a bowel movement.  You have pain or inflammation outside the area of the hemorrhoids. MAKE SURE YOU:    Understand these instructions.  Will watch your condition.  Will get help right away if you are not doing well or get worse. Document Released: 09/05/2000 Document Revised: 08/25/2012 Document Reviewed: 07/13/2012 Exit[employer] Patient Information 2014 [employer].  

## 2013-12-10 LAB — TSH: TSH: 1.313 u[IU]/mL (ref 0.350–4.500)

## 2013-12-10 LAB — PSA: PSA: <0.01 ng/mL (ref <=4.00)

## 2013-12-18 ENCOUNTER — Other Ambulatory Visit: Payer: Self-pay | Admitting: Family Medicine

## 2013-12-20 ENCOUNTER — Telehealth: Payer: Self-pay

## 2013-12-20 MED ORDER — TRAMADOL HCL 50 MG PO TABS: 50.0000 mg | ORAL_TABLET | Freq: Three times a day (TID) | ORAL | Status: DC | PRN

## 2013-12-20 NOTE — Telephone Encounter (Signed)
According to Dr Irwin BrakemanLe's note, it ok to refill.   Please advise.

## 2013-12-20 NOTE — Telephone Encounter (Signed)
Spoke to pt, he is aware 

## 2013-12-20 NOTE — Telephone Encounter (Signed)
Refilled for Dr. Conley RollsLe.

## 2013-12-20 NOTE — Telephone Encounter (Signed)
Pt would like refill on tramadol, he is aware that Dr.Le is out of town but he cannot wait until she returns to receive a refill. Best# [phone]

## 2013-12-21 ENCOUNTER — Other Ambulatory Visit: Payer: Self-pay | Admitting: Family Medicine

## 2013-12-21 NOTE — Telephone Encounter (Signed)
Faxed Rx , notified pt.

## 2013-12-25 ENCOUNTER — Other Ambulatory Visit: Payer: Self-pay | Admitting: Family Medicine

## 2014-01-13 ENCOUNTER — Ambulatory Visit (INDEPENDENT_AMBULATORY_CARE_PROVIDER_SITE_OTHER): Payer: BC Managed Care – PPO | Admitting: Family Medicine

## 2014-01-13 ENCOUNTER — Encounter: Payer: Self-pay | Admitting: Family Medicine

## 2014-01-13 VITALS — BP 140/80 | HR 84 | Temp 98.9°F | Resp 16 | Ht 76.0 in | Wt 211.4 lb

## 2014-01-13 DIAGNOSIS — K649 Unspecified hemorrhoids: Secondary | ICD-10-CM

## 2014-01-13 DIAGNOSIS — K625 Hemorrhage of anus and rectum: Secondary | ICD-10-CM

## 2014-01-13 DIAGNOSIS — R519 Headache, unspecified: Secondary | ICD-10-CM

## 2014-01-13 DIAGNOSIS — Z8546 Personal history of malignant neoplasm of prostate: Secondary | ICD-10-CM

## 2014-01-13 DIAGNOSIS — N50811 Right testicular pain: Secondary | ICD-10-CM

## 2014-01-13 DIAGNOSIS — R51 Headache: Secondary | ICD-10-CM

## 2014-01-13 LAB — IFOBT (OCCULT BLOOD): IFOBT: NEGATIVE

## 2014-01-13 MED ORDER — TRAMADOL HCL 50 MG PO TABS: 50.0000 mg | ORAL_TABLET | Freq: Three times a day (TID) | ORAL | Status: DC | PRN

## 2014-01-13 NOTE — Progress Notes (Signed)
 Chief Complaint:  Chief Complaint  Patient presents with  . Follow-up    hemorrhoids  . right testicular pain  . Headache    HPI: Gavin Fowler is a 63 y.o. male who is here for: 1. Hemorrhoids-better with meds, his pharmacist had to probably replace the verapamil with soemthing less expensive but he does not remember.  2. Right testicular pain still the same but much improved with tramadol, he takes it sparingly, 4-5 times a week.  3. Headaches-has returned, not the worst HA of his life, he feels like he has blurred vision when this happens, no other sxs. Usually when he ahs to focus at wrk. Vision test in house was normal.  However he did have a hsitory of retinal detachment in right eye after his last major HA when his BP was very high after he got an injection in the ER for his priapism in 2012, his MRI of the brain was normal. He saw Dr Dione BoozeGroat for retinal detachment, and subsequent cataract surgery  Past Medical History  Diagnosis Date  . Cancer   . GERD (gastroesophageal reflux disease)   . Cataract    Past Surgical History  Procedure Laterality Date  . Ethmoidectomy  1989  . Hernia repair  2007  . Prostate surgery  2011  . Eye surgery     History   Social History  . Marital Status: Married    Spouse Name: N/A    Number of Children: N/A  . Years of Education: N/A   Occupational History  . attorney    Social History Main Topics  . Smoking status: Former Smoker    Quit date: 09/22/1990  . Smokeless tobacco: None  . Alcohol Use: Yes     Comment: 5-6 drinks/week  . Drug Use: No  . Sexual Activity: None   Other Topics Concern  . None   Social History Narrative  . None   Family History  Problem Relation Age of Onset  . Colon cancer Father [id number]  . Breast cancer Sister    No Known Allergies Prior to Admission medications   Medication Sig Start Date End Date Taking? Authorizing Provider  diltiazem 2 % GEL Apply 1 application topically 2 (two) times  daily as needed. 12/09/13    P , DO  fluticasone (FLONASE) 50 MCG/ACT nasal spray Place 2 sprays into both nostrils daily. 11/11/13    P , DO  hydrocortisone (ANUSOL-HC) 25 MG suppository Place 1 suppository (25 mg total) rectally 2 (two) times daily as needed for hemorrhoids or itching. 12/09/13    P , DO  lidocaine (XYLOCAINE) 5 % ointment Apply 1 application topically as needed. 12/09/13    P , DO  PANTOPRAZOLE SODIUM PO Take 40 mg by mouth daily.    Historical Provider, MD  Pseudoephedrine HCl (SUDAFED PO) Take by mouth as needed.    Historical Provider, MD  sildenafil (VIAGRA) 100 MG tablet Take 1 tablet (100 mg total) by mouth as needed for erectile dysfunction. 10/06/12   Sherren MochaEva N Shaw, MD  traMADol (ULTRAM) 50 MG tablet Take 1 tablet (50 mg total) by mouth every 8 (eight) hours as needed. 12/20/13   Sondra Bargesyan M Dunn, PA-C     ROS: The patient denies fevers, chills, night sweats, unintentional weight loss, chest pain, palpitations, wheezing, dyspnea on exertion, nausea, vomiting, abdominal pain, dysuria, hematuria, melena, numbness, weakness, or tingling.   All other systems have been reviewed and were otherwise negative with the  exception of those mentioned in the HPI and as above.    PHYSICAL EXAM: Filed Vitals:   01/13/14 1616  BP: 140/80  Pulse: 84  Temp: 98.9 F (37.2 C)  Resp: 16   Filed Vitals:   01/13/14 1616  Height: 6\' 4"  (1.93 m)  Weight: 211 lb 6.4 oz (95.89 kg)   Body mass index is 25.74 kg/(m^2).  General: Alert, no acute distress HEENT:  Normocephalic, atraumatic, oropharynx patent. EOMI, PERRLA, fundo exam grossly nl, no nicking or edema Cardiovascular:  Regular rate and rhythm, no rubs murmurs or gallops.  No Carotid bruits, radial pulse intact. No pedal edema.  Respiratory: Clear to auscultation bilaterally.  No wheezes, rales, or rhonchi.  No cyanosis, no use of accessory musculature GI: No organomegaly, abdomen is soft and non-tender, positive  bowel sounds.  No masses. Skin: No rashes. Neurologic: Facial musculature symmetric. Psychiatric: Patient is appropriate throughout our interaction. Lymphatic: No cervical lymphadenopathy Musculoskeletal: Gait intact. Right testicle is tender, higher than left + hemorrhoids, not thrombosed   LABS: Results for orders placed in visit on 01/13/14  IFOBT (OCCULT BLOOD)      Result Value Ref Range   IFOBT Negative       EKG/XRAY:   [facility]   ASSESSMENT/PLAN: Encounter Diagnoses  Name Primary?  . Hemorrhoids Yes  . Rectal bleeding   . Headache   . Testicular pain, right   . History of prostate cancer    Rx tramadol #60 Monitor HA [facility] Advise to f/u with Dr Dione BoozeGroat F/u prn  Gross sideeffects, risk and benefits, and alternatives of medications d/w patient. Patient is aware that all medications have potential sideeffects and we are unable to predict every sideeffect or drug-drug interaction that may occur.  nell Antuhao P , DO 01/13/2014 5:15 PM

## 2014-01-13 NOTE — Patient Instructions (Signed)
Headaches, Analgesic Rebound °Analgesic agents are prescription or over-the-counter medications used to control pain, including headaches. However, overuse or misuse of theses medications can lead to rebound headaches. Rebound headaches are headaches that recur after the analgesic medication wears off. Eventually, the rebound headaches can become longlasting (chronic). If this happens, you must completely stop using analgesic medications. If not, the chronic headache is likely to continue despite the use of any other treatment. °Usually when you stop taking analgesic medications, the headache may initally get worse for several days. Along with this you may experience sickness in your stomach (nausea), and you may throw up (vomit). After a period of 3 to 5 days, these symptoms begin to improve. Sometimes improvement may take longer. Eventually, the headaches will slowly improve with treatment with the right medications. Most people are able to stop using analgesic medications at home with a caregiver's supervision. But some find it difficult and may require hospitalization. °Document Released: 11/29/2003 Document Revised: 12/01/2011 Document Reviewed: 03/24/2013 °ExitCare® Patient Information ©2014 [employer]. ° °Headaches, Frequently Asked Questions °MIGRAINE HEADACHES °Q: What is migraine? What causes it? How can I treat it? °A: Generally, migraine headaches begin as a dull ache. Then they develop into a constant, throbbing, and pulsating pain. You may experience pain at the temples. You may experience pain at the front or back of one or both sides of the head. The pain is usually accompanied by a combination of: °· Nausea. °· Vomiting. °· Sensitivity to light and noise. °Some people (about 15%) experience an aura (see below) before an attack. The cause of migraine is believed to be chemical reactions in the brain. Treatment for migraine may include over-the-counter or prescription medications. It may also  include self-help techniques. These include relaxation training and biofeedback.  °Q: What is an aura? °A: About 15% of people with migraine get an "aura". This is a sign of neurological symptoms that occur before a migraine headache. You may see wavy or jagged lines, dots, or flashing lights. You might experience tunnel vision or blind spots in one or both eyes. The aura can include visual or auditory hallucinations (something imagined). It may include disruptions in smell (such as strange odors), taste or touch. Other symptoms include: °· Numbness. °· A "pins and needles" sensation. °· Difficulty in recalling or speaking the correct word. °These neurological events may last as long as 60 minutes. These symptoms will fade as the headache begins. °Q: What is a trigger? °A: Certain physical or environmental factors can lead to or "trigger" a migraine. These include: °· Foods. °· Hormonal changes. °· Weather. °· Stress. °It is important to remember that triggers are different for everyone. To help prevent migraine attacks, you need to figure out which triggers affect you. Keep a headache diary. This is a good way to track triggers. The diary will help you talk to your healthcare professional about your condition. °Q: Does weather affect migraines? °A: Bright sunshine, hot, humid conditions, and drastic changes in barometric pressure may lead to, or "trigger," a migraine attack in some people. But studies have shown that weather does not act as a trigger for everyone with migraines. °Q: What is the link between migraine and hormones? °A: Hormones start and regulate many of your body's functions. Hormones keep your body in balance within a constantly changing environment. The levels of hormones in your body are unbalanced at times. Examples are during menstruation, pregnancy, or menopause. That can lead to a migraine attack. In fact, about three   quarters of all women with migraine report that their attacks are related  to the menstrual cycle.  °Q: Is there an increased risk of stroke for migraine sufferers? °A: The likelihood of a migraine attack causing a stroke is very remote. That is not to say that migraine sufferers cannot have a stroke associated with their migraines. In persons under age 40, the most common associated factor for stroke is migraine headache. But over the course of a person's normal life span, the occurrence of migraine headache may actually be associated with a reduced risk of dying from cerebrovascular disease due to stroke.  °Q: What are acute medications for migraine? °A: Acute medications are used to treat the pain of the headache after it has started. Examples over-the-counter medications, NSAIDs, ergots, and triptans.  °Q: What are the triptans? °A: Triptans are the newest class of abortive medications. They are specifically targeted to treat migraine. Triptans are vasoconstrictors. They moderate some chemical reactions in the brain. The triptans work on receptors in your brain. Triptans help to restore the balance of a neurotransmitter called serotonin. Fluctuations in levels of serotonin are thought to be a main cause of migraine.  °Q: Are over-the-counter medications for migraine effective? °A: Over-the-counter, or "OTC," medications may be effective in relieving mild to moderate pain and associated symptoms of migraine. But you should see your caregiver before beginning any treatment regimen for migraine.  °Q: What are preventive medications for migraine? °A: Preventive medications for migraine are sometimes referred to as "prophylactic" treatments. They are used to reduce the frequency, severity, and length of migraine attacks. Examples of preventive medications include antiepileptic medications, antidepressants, beta-blockers, calcium channel blockers, and NSAIDs (nonsteroidal anti-inflammatory drugs). °Q: Why are anticonvulsants used to treat migraine? °A: During the past few years, there has  been an increased interest in antiepileptic drugs for the prevention of migraine. They are sometimes referred to as "anticonvulsants". Both epilepsy and migraine may be caused by similar reactions in the brain.  °Q: Why are antidepressants used to treat migraine? °A: Antidepressants are typically used to treat people with depression. They may reduce migraine frequency by regulating chemical levels, such as serotonin, in the brain.  °Q: What alternative therapies are used to treat migraine? °A: The term "alternative therapies" is often used to describe treatments considered outside the scope of conventional Western medicine. Examples of alternative therapy include acupuncture, acupressure, and yoga. Another common alternative treatment is herbal therapy. Some herbs are believed to relieve headache pain. Always discuss alternative therapies with your caregiver before proceeding. Some herbal products contain arsenic and other toxins. °TENSION HEADACHES °Q: What is a tension-type headache? What causes it? How can I treat it? °A: Tension-type headaches occur randomly. They are often the result of temporary stress, anxiety, fatigue, or anger. Symptoms include soreness in your temples, a tightening band-like sensation around your head (a "vice-like" ache). Symptoms can also include a pulling feeling, pressure sensations, and contracting head and neck muscles. The headache begins in your forehead, temples, or the back of your head and neck. Treatment for tension-type headache may include over-the-counter or prescription medications. Treatment may also include self-help techniques such as relaxation training and biofeedback. °CLUSTER HEADACHES °Q: What is a cluster headache? What causes it? How can I treat it? °A: Cluster headache gets its name because the attacks come in groups. The pain arrives with little, if any, warning. It is usually on one side of the head. A tearing or bloodshot eye and a runny nose on   the same side  of the headache may also accompany the pain. Cluster headaches are believed to be caused by chemical reactions in the brain. They have been described as the most severe and intense of any headache type. Treatment for cluster headache includes prescription medication and oxygen. °SINUS HEADACHES °Q: What is a sinus headache? What causes it? How can I treat it? °A: When a cavity in the bones of the face and skull (a sinus) becomes inflamed, the inflammation will cause localized pain. This condition is usually the result of an allergic reaction, a tumor, or an infection. If your headache is caused by a sinus blockage, such as an infection, you will probably have a fever. An x-ray will confirm a sinus blockage. Your caregiver's treatment might include antibiotics for the infection, as well as antihistamines or decongestants.  °REBOUND HEADACHES °Q: What is a rebound headache? What causes it? How can I treat it? °A: A pattern of taking acute headache medications too often can lead to a condition known as "rebound headache." A pattern of taking too much headache medication includes taking it more than 2 days per week or in excessive amounts. That means more than the label or a caregiver advises. With rebound headaches, your medications not only stop relieving pain, they actually begin to cause headaches. Doctors treat rebound headache by tapering the medication that is being overused. Sometimes your caregiver will gradually substitute a different type of treatment or medication. Stopping may be a challenge. Regularly overusing a medication increases the potential for serious side effects. Consult a caregiver if you regularly use headache medications more than 2 days per week or more than the label advises. °ADDITIONAL QUESTIONS AND ANSWERS °Q: What is biofeedback? °A: Biofeedback is a self-help treatment. Biofeedback uses special equipment to monitor your body's involuntary physical responses. Biofeedback  monitors: °· Breathing. °· Pulse. °· Heart rate. °· Temperature. °· Muscle tension. °· Brain activity. °Biofeedback helps you refine and perfect your relaxation exercises. You learn to control the physical responses that are related to stress. Once the technique has been mastered, you do not need the equipment any more. °Q: Are headaches hereditary? °A: Four out of five (80%) of people that suffer report a family history of migraine. Scientists are not sure if this is genetic or a family predisposition. Despite the uncertainty, a child has a 50% chance of having migraine if one parent suffers. The child has a 75% chance if both parents suffer.  °Q: Can children get headaches? °A: By the time they reach high school, most young people have experienced some type of headache. Many safe and effective approaches or medications can prevent a headache from occurring or stop it after it has begun.  °Q: What type of doctor should I see to diagnose and treat my headache? °A: Start with your primary caregiver. Discuss his or her experience and approach to headaches. Discuss methods of classification, diagnosis, and treatment. Your caregiver may decide to recommend you to a headache specialist, depending upon your symptoms or other physical conditions. Having diabetes, allergies, etc., may require a more comprehensive and inclusive approach to your headache. The [employer] will provide, upon request, a list of NHF physician members in your state. °Document Released: 11/29/2003 Document Revised: 12/01/2011 Document Reviewed: 05/08/2008 °ExitCare® Patient Information ©2014 [employer]. ° °

## 2014-01-15 ENCOUNTER — Encounter: Payer: Self-pay | Admitting: Family Medicine

## 2014-01-19 ENCOUNTER — Other Ambulatory Visit: Payer: Self-pay | Admitting: Family Medicine

## 2014-01-19 DIAGNOSIS — Z8546 Personal history of malignant neoplasm of prostate: Secondary | ICD-10-CM

## 2014-01-19 DIAGNOSIS — K649 Unspecified hemorrhoids: Secondary | ICD-10-CM

## 2014-02-28 ENCOUNTER — Ambulatory Visit (INDEPENDENT_AMBULATORY_CARE_PROVIDER_SITE_OTHER): Payer: BC Managed Care – PPO | Admitting: General Surgery

## 2014-03-14 ENCOUNTER — Ambulatory Visit (INDEPENDENT_AMBULATORY_CARE_PROVIDER_SITE_OTHER): Payer: BC Managed Care – PPO | Admitting: General Surgery

## 2014-04-18 ENCOUNTER — Other Ambulatory Visit: Payer: Self-pay | Admitting: Family Medicine

## 2014-04-19 MED ORDER — TRAMADOL HCL 50 MG PO TABS: 50.0000 mg | ORAL_TABLET | Freq: Three times a day (TID) | ORAL | Status: DC | PRN

## 2014-04-19 NOTE — Telephone Encounter (Signed)
Called in. Pt notified in MyChart.

## 2014-06-09 ENCOUNTER — Other Ambulatory Visit: Payer: Self-pay | Admitting: Family Medicine

## 2014-06-10 MED ORDER — TRAMADOL HCL 50 MG PO TABS: 50.0000 mg | ORAL_TABLET | Freq: Three times a day (TID) | ORAL | Status: DC | PRN

## 2014-06-12 ENCOUNTER — Telehealth: Payer: Self-pay | Admitting: *Deleted

## 2014-06-12 NOTE — Telephone Encounter (Signed)
Called in.

## 2014-06-12 NOTE — Telephone Encounter (Signed)
Called in Tramadol Rx to pharmacy.

## 2014-08-23 ENCOUNTER — Other Ambulatory Visit: Payer: Self-pay | Admitting: Family Medicine

## 2014-08-25 ENCOUNTER — Other Ambulatory Visit: Payer: Self-pay | Admitting: Family Medicine

## 2014-08-25 MED ORDER — TRAMADOL HCL 50 MG PO TABS: 50.0000 mg | ORAL_TABLET | Freq: Three times a day (TID) | ORAL | Status: DC | PRN

## 2014-11-13 ENCOUNTER — Telehealth (INDEPENDENT_AMBULATORY_CARE_PROVIDER_SITE_OTHER): Payer: Self-pay | Admitting: General Surgery

## 2014-11-13 ENCOUNTER — Other Ambulatory Visit (INDEPENDENT_AMBULATORY_CARE_PROVIDER_SITE_OTHER): Payer: Self-pay | Admitting: General Surgery

## 2014-11-13 NOTE — Telephone Encounter (Signed)
Opened in error

## 2014-11-13 NOTE — H&P (Signed)
Gavin Fowler 11/13/2014 1:56 PM Location: [facility] Patient #: [id number] DOB: November 01, 1950 Married / Language: [place] / Race: White Male  History of Present Illness Gavin Fowler(Gavin Lein MD; 11/13/2014 4:18 PM) Patient words: recheck hems.  The patient is a 64 year old male who presents with hemorrhoids. Patient is here today to discuss bleeding prolapsed internal hemorrhoids. He reports he has rectal bleeding on a weekly basis. This occurs for several days in a row and then stops. He also reports prolapse on a daily basis requiring manual reduction. He is having soft bowel movements by eating bran cereal every morning. Any constipation or straining. The symptoms have been present for several years. I saw him 1 year ago recommended THD. He is undergoing surgery in March. He is coming in to discuss possible surgical correction of his hemorrhoids after that.   Other Problems [name](Ashley Beck, CMA; 11/13/2014 1:56 PM) Diverticulosis Gastroesophageal Reflux Disease Hemorrhoids Inguinal Hernia Prostate Cancer  Past Surgical History [name](Ashley Beck, CMA; 11/13/2014 1:56 PM) Colon Polyp Removal - Colonoscopy Laparoscopic Inguinal Hernia Surgery Right. Open Inguinal Hernia Surgery Bilateral. Prostate Surgery - Removal  Diagnostic Studies History [name](Ashley Beck, CMA; 11/13/2014 1:56 PM) Colonoscopy 1-5 years ago  Allergies [name](Ashley Beck, CMA; 11/13/2014 1:57 PM) No Known Drug Allergies02/22/2016  Medication History [name](Ashley Beck, CMA; 11/13/2014 1:58 PM) Pantoprazole Sodium (40MG  Tablet DR, Oral) Active. Medications Reconciled  Social History [name](Ashley Beck, [place]; 11/13/2014 1:56 PM) Alcohol use Occasional alcohol use. Caffeine use Coffee. No drug use Tobacco use Former smoker.  Family History [name](Ashley Beck, [place]; 11/13/2014 1:56 PM) Breast Cancer Sister. Colon Cancer Father. Melanoma Father.  Review of Systems [name](Ashley Beck CMA; 11/13/2014 1:56 PM) General Not  Present- Appetite Loss, Chills, Fatigue, Fever, Night Sweats, Weight Gain and Weight Loss. Skin Not Present- Change in Wart/Mole, Dryness, Hives, Jaundice, New Lesions, Non-Healing Wounds, Rash and Ulcer. HEENT Not Present- Earache, Hearing Loss, Hoarseness, Nose Bleed, Oral Ulcers, Ringing in the Ears, Seasonal Allergies, Sinus Pain, Sore Throat, Visual Disturbances, Wears glasses/contact lenses and Yellow Eyes. Breast Not Present- Breast Mass, Breast Pain, Nipple Discharge and Skin Changes. Cardiovascular Not Present- Chest Pain, Difficulty Breathing Lying Down, Leg Cramps, Palpitations, Rapid Heart Rate, Shortness of Breath and Swelling of Extremities. Gastrointestinal Present- Hemorrhoids. Not Present- Abdominal Pain, Bloating, Bloody Stool, Change in Bowel Habits, Chronic diarrhea, Constipation, Difficulty Swallowing, Excessive gas, Gets full quickly at meals, Indigestion, Nausea, Rectal Pain and Vomiting. Male Genitourinary Present- Frequency, Impotence, Urgency and Urine Leakage. Not Present- Blood in Urine, Change in Urinary Stream, Nocturia and Painful Urination. Musculoskeletal Not Present- Back Pain, Joint Pain, Joint Stiffness, Muscle Pain, Muscle Weakness and Swelling of Extremities. Neurological Present- Headaches. Not Present- Decreased Memory, Fainting, Numbness, Seizures, Tingling, Tremor, Trouble walking and Weakness. Psychiatric Not Present- Anxiety, Bipolar, Change in Sleep Pattern, Depression, Fearful and Frequent crying. Endocrine Not Present- Cold Intolerance, Excessive Hunger, Hair Changes, Heat Intolerance, Hot flashes and New Diabetes. Hematology Not Present- Easy Bruising, Excessive bleeding, Gland problems, HIV and Persistent Infections.   Vitals [name](Ashley Beck CMA; 11/13/2014 1:58 PM) 11/13/2014 1:58 PM Weight: 214 lb Height: 77in Body Surface Area: 2.3 m Body Mass Index: 25.38 kg/m Temp.: 97.65F(Temporal)  Pulse: 72 (Regular)  Resp.: 18 (Unlabored)  BP:  140/62 (Sitting, Left Arm, Standard)    Physical Exam Gavin Fowler(Bryttani Blew MD; 11/13/2014 4:19 PM) General Mental Status-Alert. General Appearance-Consistent with stated age. Hydration-Well hydrated. Voice-Normal.  Head and Neck Head-normocephalic, atraumatic with no lesions or palpable masses. Trachea-midline. Thyroid Gland Characteristics - normal size and  consistency.  Eye Eyeball - Bilateral-Extraocular movements intact. Sclera/Conjunctiva - Bilateral-No scleral icterus.  Chest and Lung Exam Chest and lung exam reveals -quiet, even and easy respiratory effort with no use of accessory muscles and on auscultation, normal breath sounds, no adventitious sounds and normal vocal resonance. Inspection Chest Wall - Normal. Back - normal.  Cardiovascular Cardiovascular examination reveals -normal heart sounds, regular rate and rhythm with no murmurs and normal pedal pulses bilaterally.  Abdomen Inspection Inspection of the abdomen reveals - No Hernias. Palpation/Percussion Palpation and Percussion of the abdomen reveal - Soft, Non Tender, No Rebound tenderness, No Rigidity (guarding) and No hepatosplenomegaly. Auscultation Auscultation of the abdomen reveals - Bowel sounds normal.  Rectal Anorectal Exam External - skin tag. Internal - normal sphincter tone and internal hemorrhoids(Grade 3, right anterior).  Neurologic Neurologic evaluation reveals -alert and oriented x 3 with no impairment of recent or remote memory. Mental Status-Normal.  Musculoskeletal Global Assessment -Note: no gross deformities.  Normal Exam - Left-Upper Extremity Strength Normal and Lower Extremity Strength Normal. Normal Exam - Right-Upper Extremity Strength Normal and Lower Extremity Strength Normal.    Assessment & Plan Gavin Levee MD; 11/13/2014 2:51 PM) INTERNAL HEMORRHOIDS (455.0  K64.8) Story: Patient with grade 3 internal hemorrhoids causing bleeding and  severe discomfort. I have recommended once again for him to undergo THD. We discussed this procedure in detail. I have given him educational pamphlets on this as well. He is having a penile prosthesis placed in March. He would like to do the surgery after this. He will call the office when he is ready to schedule. We discussed the importance of keeping his bowel habits as regular as possible before and after surgery. Risk of the procedure were discussed. These include bleeding, recurrence and pain.     Signed by Gavin Levee, MD (11/13/2014 4:20 PM)

## 2014-12-18 ENCOUNTER — Encounter (HOSPITAL_BASED_OUTPATIENT_CLINIC_OR_DEPARTMENT_OTHER): Payer: Self-pay | Admitting: *Deleted

## 2014-12-19 ENCOUNTER — Encounter (HOSPITAL_BASED_OUTPATIENT_CLINIC_OR_DEPARTMENT_OTHER): Payer: Self-pay | Admitting: *Deleted

## 2014-12-19 NOTE — Progress Notes (Signed)
NPO AFTER MN.  ARRIVE AT 0600.  NEEDS HG.  WILL TAKE PROTONIX AM DOS W/ SIPS OF WATER. 

## 2014-12-22 ENCOUNTER — Ambulatory Visit (HOSPITAL_BASED_OUTPATIENT_CLINIC_OR_DEPARTMENT_OTHER): Payer: BLUE CROSS/BLUE SHIELD | Admitting: Anesthesiology

## 2014-12-22 ENCOUNTER — Ambulatory Visit (HOSPITAL_BASED_OUTPATIENT_CLINIC_OR_DEPARTMENT_OTHER)
Admission: RE | Admit: 2014-12-22 | Discharge: 2014-12-22 | Disposition: A | Discharge status: Home / Self Care | Payer: BLUE CROSS/BLUE SHIELD | Source: Ambulatory Visit | Attending: General Surgery | Admitting: General Surgery

## 2014-12-22 ENCOUNTER — Encounter (HOSPITAL_BASED_OUTPATIENT_CLINIC_OR_DEPARTMENT_OTHER)
Admission: RE | Disposition: A | Discharge status: Home / Self Care | Payer: Self-pay | Source: Ambulatory Visit | Attending: General Surgery

## 2014-12-22 ENCOUNTER — Encounter (HOSPITAL_BASED_OUTPATIENT_CLINIC_OR_DEPARTMENT_OTHER): Payer: Self-pay | Admitting: *Deleted

## 2014-12-22 DIAGNOSIS — Z86010 Personal history of colonic polyps: Secondary | ICD-10-CM | POA: Insufficient documentation

## 2014-12-22 DIAGNOSIS — K219 Gastro-esophageal reflux disease without esophagitis: Secondary | ICD-10-CM | POA: Diagnosis not present

## 2014-12-22 DIAGNOSIS — K642 Third degree hemorrhoids: Secondary | ICD-10-CM | POA: Diagnosis present

## 2014-12-22 DIAGNOSIS — Z8546 Personal history of malignant neoplasm of prostate: Secondary | ICD-10-CM | POA: Insufficient documentation

## 2014-12-22 DIAGNOSIS — K5790 Diverticulosis of intestine, part unspecified, without perforation or abscess without bleeding: Secondary | ICD-10-CM | POA: Insufficient documentation

## 2014-12-22 HISTORY — DX: Personal history of colonic polyps: Z86.010

## 2014-12-22 HISTORY — DX: Personal history of colon polyps, unspecified: Z86.0100

## 2014-12-22 HISTORY — PX: TRANSANAL HEMORRHOIDAL DEARTERIALIZATION: SHX6136

## 2014-12-22 HISTORY — DX: Personal history of malignant neoplasm of prostate: Z85.46

## 2014-12-22 HISTORY — DX: Testicular pain, unspecified: N50.819

## 2014-12-22 HISTORY — DX: Personal history of other diseases of the digestive system: Z87.19

## 2014-12-22 HISTORY — DX: Male erectile dysfunction, unspecified: N52.9

## 2014-12-22 HISTORY — DX: Other intervertebral disc degeneration, lumbar region without mention of lumbar back pain or lower extremity pain: M51.369

## 2014-12-22 HISTORY — DX: Stress incontinence (female) (male): N39.3

## 2014-12-22 HISTORY — DX: Other intervertebral disc degeneration, lumbar region: M51.36

## 2014-12-22 HISTORY — DX: Other hemorrhoids: K64.8

## 2014-12-22 HISTORY — DX: Diverticulosis of large intestine without perforation or abscess without bleeding: K57.30

## 2014-12-22 LAB — POCT HEMOGLOBIN-HEMACUE: Hemoglobin: 16 g/dL (ref 13.0–17.0)

## 2014-12-22 SURGERY — TRANSANAL HEMORRHOIDAL DEARTERIALIZATION
Anesthesia: Monitor Anesthesia Care | Site: Rectum

## 2014-12-22 MED ORDER — DEXAMETHASONE SODIUM PHOSPHATE 4 MG/ML IJ SOLN
INTRAMUSCULAR | Status: DC | PRN
  Administered 2014-12-22: 10 mg via INTRAVENOUS

## 2014-12-22 MED ORDER — DIAZEPAM 5 MG PO TABS: 5.0000 mg | ORAL_TABLET | Freq: Four times a day (QID) | ORAL | Status: DC | PRN

## 2014-12-22 MED ORDER — PROMETHAZINE HCL 25 MG/ML IJ SOLN
6.2500 mg | INTRAMUSCULAR | Status: DC | PRN
  Filled 2014-12-22: qty 1

## 2014-12-22 MED ORDER — HYDROMORPHONE HCL 1 MG/ML IJ SOLN
0.2500 mg | INTRAMUSCULAR | Status: DC | PRN
  Administered 2014-12-22: 0.25 mg via INTRAVENOUS
  Administered 2014-12-22: 0.5 mg via INTRAVENOUS
  Filled 2014-12-22: qty 1

## 2014-12-22 MED ORDER — KETAMINE HCL 50 MG/ML IJ SOLN
INTRAMUSCULAR | Status: AC
  Filled 2014-12-22: qty 10

## 2014-12-22 MED ORDER — FENTANYL CITRATE 0.05 MG/ML IJ SOLN
INTRAMUSCULAR | Status: AC
  Filled 2014-12-22: qty 4

## 2014-12-22 MED ORDER — SODIUM CHLORIDE 0.9 % IJ SOLN
3.0000 mL | INTRAMUSCULAR | Status: DC | PRN
  Filled 2014-12-22: qty 3

## 2014-12-22 MED ORDER — ACETAMINOPHEN 650 MG RE SUPP
650.0000 mg | RECTAL | Status: DC | PRN
  Filled 2014-12-22: qty 1

## 2014-12-22 MED ORDER — OXYCODONE HCL 5 MG PO TABS
ORAL_TABLET | ORAL | Status: AC
  Filled 2014-12-22: qty 1

## 2014-12-22 MED ORDER — SODIUM CHLORIDE 0.9 % IV SOLN
250.0000 mg | INTRAVENOUS | Status: DC | PRN
  Administered 2014-12-22: 20 ug/kg/min via INTRAVENOUS

## 2014-12-22 MED ORDER — MIDAZOLAM HCL 5 MG/5ML IJ SOLN
INTRAMUSCULAR | Status: DC | PRN
  Administered 2014-12-22: 2 mg via INTRAVENOUS

## 2014-12-22 MED ORDER — OXYCODONE HCL 5 MG PO TABS: 5.0000 mg | ORAL_TABLET | ORAL | Status: DC | PRN

## 2014-12-22 MED ORDER — ACETAMINOPHEN 325 MG PO TABS
650.0000 mg | ORAL_TABLET | ORAL | Status: DC | PRN
  Filled 2014-12-22: qty 2

## 2014-12-22 MED ORDER — BUPIVACAINE LIPOSOME 1.3 % IJ SUSP
INTRAMUSCULAR | Status: DC | PRN
  Administered 2014-12-22: 20 mL

## 2014-12-22 MED ORDER — LIDOCAINE 5 % EX OINT
TOPICAL_OINTMENT | CUTANEOUS | Status: DC | PRN
  Administered 2014-12-22: 1

## 2014-12-22 MED ORDER — SODIUM CHLORIDE 0.9 % IJ SOLN
3.0000 mL | Freq: Two times a day (BID) | INTRAMUSCULAR | Status: DC
  Filled 2014-12-22: qty 3

## 2014-12-22 MED ORDER — OXYCODONE HCL 5 MG/5ML PO SOLN
5.0000 mg | Freq: Once | ORAL | Status: AC | PRN
  Filled 2014-12-22: qty 5

## 2014-12-22 MED ORDER — ONDANSETRON HCL 4 MG/2ML IJ SOLN
INTRAMUSCULAR | Status: DC | PRN
  Administered 2014-12-22: 4 mg via INTRAVENOUS

## 2014-12-22 MED ORDER — LACTATED RINGERS IV SOLN
INTRAVENOUS | Status: DC
  Administered 2014-12-22: 07:00:00 via INTRAVENOUS
  Filled 2014-12-22: qty 1000

## 2014-12-22 MED ORDER — OXYCODONE HCL 5 MG PO TABS
5.0000 mg | ORAL_TABLET | Freq: Once | ORAL | Status: AC | PRN
  Administered 2014-12-22: 5 mg via ORAL
  Filled 2014-12-22: qty 1

## 2014-12-22 MED ORDER — PROPOFOL 10 MG/ML IV EMUL
INTRAVENOUS | Status: DC | PRN
  Administered 2014-12-22: 200 ug/kg/min via INTRAVENOUS

## 2014-12-22 MED ORDER — HYDROMORPHONE HCL 1 MG/ML IJ SOLN
INTRAMUSCULAR | Status: AC
  Filled 2014-12-22: qty 1

## 2014-12-22 MED ORDER — BUPIVACAINE-EPINEPHRINE 0.5% -1:200000 IJ SOLN
INTRAMUSCULAR | Status: DC | PRN
  Administered 2014-12-22: 30 mL

## 2014-12-22 MED ORDER — SODIUM CHLORIDE 0.9 % IJ SOLN
INTRAMUSCULAR | Status: DC | PRN
  Administered 2014-12-22: 10 mL

## 2014-12-22 MED ORDER — SODIUM CHLORIDE 0.9 % IR SOLN
Status: DC | PRN
  Administered 2014-12-22: 500 mL

## 2014-12-22 MED ORDER — LIDOCAINE HCL (CARDIAC) 20 MG/ML IV SOLN
INTRAVENOUS | Status: DC | PRN
  Administered 2014-12-22: 75 mg via INTRAVENOUS

## 2014-12-22 MED ORDER — OXYCODONE HCL 5 MG PO TABS
5.0000 mg | ORAL_TABLET | ORAL | Status: DC | PRN
  Filled 2014-12-22: qty 2

## 2014-12-22 MED ORDER — SODIUM CHLORIDE 0.9 % IV SOLN
250.0000 mL | INTRAVENOUS | Status: DC | PRN
  Filled 2014-12-22: qty 250

## 2014-12-22 MED ORDER — MIDAZOLAM HCL 2 MG/2ML IJ SOLN
INTRAMUSCULAR | Status: AC
  Filled 2014-12-22: qty 2

## 2014-12-22 MED ORDER — FENTANYL CITRATE 0.05 MG/ML IJ SOLN
INTRAMUSCULAR | Status: DC | PRN
  Administered 2014-12-22: 50 ug via INTRAVENOUS
  Administered 2014-12-22 (×2): 25 ug via INTRAVENOUS

## 2014-12-22 SURGICAL SUPPLY — 30 items
BRIEF STRETCH FOR OB PAD LRG (UNDERPADS AND DIAPERS) ×3 IMPLANT
COVER BACK TABLE 60X90IN (DRAPES) ×3 IMPLANT
COVER LIGHT HANDLE  1/PK (MISCELLANEOUS) ×2
COVER LIGHT HANDLE 1/PK (MISCELLANEOUS) ×1 IMPLANT
DRAPE LG THREE QUARTER DISP (DRAPES) ×3 IMPLANT
DRAPE UNDERBUTTOCKS STRL (DRAPE) ×3 IMPLANT
DRSG PAD ABDOMINAL 8X10 ST (GAUZE/BANDAGES/DRESSINGS) ×3 IMPLANT
GAUZE SPONGE 4X4 16PLY XRAY LF (GAUZE/BANDAGES/DRESSINGS) ×3 IMPLANT
GLOVE BIO SURGEON STRL SZ 6.5 (GLOVE) ×2 IMPLANT
GLOVE BIO SURGEONS STRL SZ 6.5 (GLOVE) ×1
GLOVE BIOGEL PI IND STRL 7.0 (GLOVE) ×1 IMPLANT
GLOVE BIOGEL PI IND STRL 7.5 (GLOVE) ×2 IMPLANT
GLOVE BIOGEL PI INDICATOR 7.0 (GLOVE) ×2
GLOVE BIOGEL PI INDICATOR 7.5 (GLOVE) ×4
GLOVE SURG SS PI 7.5 STRL IVOR (GLOVE) ×6 IMPLANT
GOWN STRL REUS W/ TWL XL LVL3 (GOWN DISPOSABLE) ×1 IMPLANT
GOWN STRL REUS W/TWL 2XL LVL3 (GOWN DISPOSABLE) ×3 IMPLANT
GOWN STRL REUS W/TWL XL LVL3 (GOWN DISPOSABLE) ×5 IMPLANT
KIT SLIDE ONE PROLAPS HEMORR (KITS) ×3 IMPLANT
LEGGING LITHOTOMY PAIR STRL (DRAPES) ×3 IMPLANT
LUBRICANT JELLY K Y 4OZ (MISCELLANEOUS) ×3 IMPLANT
NEEDLE HYPO 22GX1.5 SAFETY (NEEDLE) ×3 IMPLANT
PACK BASIN DAY SURGERY FS (CUSTOM PROCEDURE TRAY) ×3 IMPLANT
SPONGE GAUZE 4X4 12PLY STER LF (GAUZE/BANDAGES/DRESSINGS) ×3 IMPLANT
SYR CONTROL 10ML LL (SYRINGE) ×3 IMPLANT
TOWEL OR 17X24 6PK STRL BLUE (TOWEL DISPOSABLE) ×6 IMPLANT
TRAY DSU PREP LF (CUSTOM PROCEDURE TRAY) ×3 IMPLANT
TUBE CONNECTING 12'X1/4 (SUCTIONS) ×1
TUBE CONNECTING 12X1/4 (SUCTIONS) ×2 IMPLANT
YANKAUER SUCT BULB TIP NO VENT (SUCTIONS) ×3 IMPLANT

## 2014-12-22 NOTE — Anesthesia Postprocedure Evaluation (Signed)
  Anesthesia Post-op Note  Patient: Gavin SackJohn H Fowler  Procedure(s) Performed: Procedure(s): TRANSANAL HEMORRHOIDAL DEARTERIALIZATION (N/A)  Patient Location: PACU  Anesthesia Type:MAC  Level of Consciousness: awake and alert   Airway and Oxygen Therapy: Patient Spontanous Breathing  Post-op Pain: none  Post-op Assessment: Post-op Vital signs reviewed  Post-op Vital Signs: stable  Last Vitals:  Filed Vitals:   12/22/14 0910  BP:   Pulse: 69  Temp:   Resp: 12    Complications: No apparent anesthesia complications

## 2014-12-22 NOTE — Op Note (Signed)
12/22/2014  8:28 AM  PATIENT:  Gavin Fowler  64 y.o. male  Patient Care Team: Andi DevonKimberly Shelton, MD as PCP - General (Internal Medicine)  PRE-OPERATIVE DIAGNOSIS:  bleeding internal hemorrhoids  POST-OPERATIVE DIAGNOSIS:  bleeding internal hemorrhoids  PROCEDURE:   TRANSANAL HEMORRHOIDAL DEARTERIALIZATION  SURGEON:  Surgeon(s): Romie LeveeAlicia Dorris Pierre, MD  ASSISTANT: none   ANESTHESIA:   local and MAC  SPECIMEN:  No Specimen  DISPOSITION OF SPECIMEN:  N/A  COUNTS:  YES  PLAN OF CARE: Discharge to home after PACU  PATIENT DISPOSITION:  PACU - hemodynamically stable.  INDICATION: 64 y.o. M with bleeding internal hemorrhoids, uncontrolled with dietary modifications and medications.     OR FINDINGS: Grade 2&3 internal hemorrhoids   DESCRIPTION: the patient was identified in the preoperative holding area and taken to the OR where they were laid on the operating room table.  MAC anesthesia was induced without difficulty. The patient was then positioned in prone jackknife position with buttocks gently taped apart.  The patient was then prepped and draped in usual sterile fashion.  SCDs were noted to be in place prior to the initiation of anesthesia. A surgical timeout was performed indicating the correct patient, procedure, positioning and need for preoperative antibiotics.  A rectal block was performed with Marcaine.    I began with a digital rectal exam.  There were no masses noted  I then placed a Hill-Ferguson anoscope into the anal canal and evaluated this completely.   I switched over to the [facility] fiber[facility]ically lit Doppler anocope.   Using the Doppler on the tip of the THD anoscope, I identified the arterial hemorrhoidal vessels coming in in the classic hexagonal anatomical pattern (right posterior/lateral/anterior, left posterior /lateral/anterior).    I proceeded to ligate the hemorrhoidal arteries. I used a 2-0 Vicryl suture on a UR-6 needle in a figure-of-eight fashion over the  signal around 6 cm proximal to the anal verge. I then ran that stitch longitudinally more distally to the white line of [place]. I then tied that stitch down to cause a hemorrhoidopexy. I did that for all 6 locations.    I redid Doppler anoscopy. I Identified a signal at the R posterior and lateral locations.  I isolated and ligated each of these with a figure-of-eight stitch. Signals went away.  At completion of this, all hemorrhoids were reduced into the rectum.  There is no more prolapse. External anatomy looked normal.  I repeated anoscopy and examination.   Hemostasis was good. I injected Exparel for post operative pain management as a rectal block. Patient is being extubated go to recovery room.  I am about to discuss the patient's status to the family.

## 2014-12-22 NOTE — Anesthesia Procedure Notes (Signed)
Procedure Name: MAC Date/Time: 12/22/2014 7:32 AM Performed by: Tyrone NineSAUVE, Dorma Altman F Pre-anesthesia Checklist: Patient identified, Timeout performed, Emergency Drugs available, Suction available and Patient being monitored Patient Re-evaluated:Patient Re-evaluated prior to inductionOxygen Delivery Method: Nasal cannula Intubation Type: IV induction Placement Confirmation: breath sounds checked- equal and bilateral and positive ETCO2 Dental Injury: Teeth and Oropharynx as per pre-operative assessment

## 2014-12-22 NOTE — Anesthesia Preprocedure Evaluation (Addendum)
Anesthesia Evaluation  Patient identified by MRN, date of birth, ID band Patient awake    Reviewed: Allergy & Precautions, NPO status , Patient's Chart, lab work & pertinent test results  History of Anesthesia Complications Negative for: history of anesthetic complications  Airway Mallampati: I  TM Distance: >3 FB Neck ROM: Full    Dental  (+) Teeth Intact, Dental Advisory Given, Caps   Pulmonary former smoker,  breath sounds clear to auscultation        Cardiovascular negative cardio ROS  Rhythm:Regular Rate:Normal     Neuro/Psych    GI/Hepatic GERD-  ,Diverticulitis    Endo/Other    Renal/GU      Musculoskeletal  (+) Arthritis -,   Abdominal   Peds  Hematology   Anesthesia Other Findings All crowns on molars  Reproductive/Obstetrics                        Anesthesia Physical Anesthesia Plan  ASA: II  Anesthesia Plan: General and MAC   Post-op Pain Management:    Induction: Intravenous  Airway Management Planned: LMA and Oral ETT  Additional Equipment:   Intra-op Plan:   Post-operative Plan: Extubation in OR  Informed Consent:   Dental advisory given  Plan Discussed with: CRNA, Surgeon and Anesthesiologist  Anesthesia Plan Comments:       Anesthesia Quick Evaluation

## 2014-12-22 NOTE — Discharge Instructions (Addendum)
ANORECTAL SURGERY: POST OP INSTRUCTIONS °1. Take your usually prescribed home medications unless otherwise directed. °2. DIET: During the first few hours after surgery sip on some liquids until you are able to urinate.  It is normal to not urinate for several hours after this surgery.  If you feel uncomfortable, please contact the office for instructions.  After you are able to urinate,you may eat, if you feel like it.  Follow a light bland diet the first 24 hours after arrival home, such as soup, liquids, crackers, etc.  Be sure to include lots of fluids daily (6-8 glasses).  Avoid fast food or heavy meals, as your are more likely to get nauseated.  Eat a low fat diet the next few days after surgery.  Limit caffeine intake to 1-2 servings a day. °3. PAIN CONTROL: °a. Pain is best controlled by a usual combination of several different methods TOGETHER: °i. Muscle relaxation °1.  Soak in a warm bath (or Sitz bath) three times a day and after bowel movements.  Continue to do this until all pain is resolved. °2. Take the muscle relaxer (Valium) every 6 hours for the first 2 days after surgery  °ii. Over the counter pain medication °iii. Prescription pain medication °b. Most patients will experience some swelling and discomfort in the anus/rectal area and incisions.  Heat such as warm towels, sitz baths, warm baths, etc to help relax tight/sore spots and speed recovery.  Some people prefer to use ice, especially in the first couple days after surgery, as it may decrease the pain and swelling, or alternate between ice & heat.  Experiment to what works for you.  Swelling and bruising can take several weeks to resolve.  Pain can take even longer to completely resolve. °c. It is helpful to take an over-the-counter pain medication regularly for the first few weeks.  Choose one of the following that works best for you: °i. Naproxen (Aleve, etc)  Two 220mg tabs twice a day °ii. Ibuprofen (Advil, etc) Three 200mg tabs four  times a day (every meal & bedtime) °d. A  prescription for pain medication (such as percocet, oxycodone, hydrocodone, etc) should be given to you upon discharge.  Take your pain medication as prescribed.  °i. If you are having problems/concerns with the prescription medicine (does not control pain, nausea, vomiting, rash, itching, etc), please call us [phone] to see if we need to switch you to a different pain medicine that will work better for you and/or control your side effect better. °ii. If you need a refill on your pain medication, please contact your pharmacy.  They will contact our office to request authorization. Prescriptions will not be filled after 5 pm or on week-ends. °4. KEEP YOUR BOWELS REGULAR and AVOID CONSTIPATION °a. The goal is one to two soft bowel movements a day.  You should at least have a bowel movement every other day. °b. Avoid getting constipated.  Between the surgery and the pain medications, it is common to experience some constipation. This can be very painful after rectal surgery.  Increasing fluid intake and taking a fiber supplement (such as Metamucil, Citrucel, FiberCon, etc) 1-2 times a day regularly will usually help prevent this problem from occurring.  A stool softener like colace is also recommended.  This can be purchased over the counter at your pharmacy.  You can take it up to 3 times a day.  If you do not have a bowel movement after 24 hrs since your surgery,   take one does of milk of magnesia.  If you still haven't had a bowel movement 8-12 hours after that dose, take another dose.  If you don't have a bowel movement 48 hrs after surgery, purchase a Fleets enema from the drug store and administer gently per package instructions.  If you still are having trouble with your bowel movements after that, please call the office for further instructions. °c. If you develop diarrhea or have many loose bowel movements, simplify your diet to bland foods & liquids for a few  days.  Stop any stool softeners and decrease your fiber supplement.  Switching to mild anti-diarrheal medications (Kayopectate, Pepto Bismol) can help.  If this worsens or does not improve, please call us. ° °5. Wound Care °a. Remove your bandages before your first bowel movement or 8 hours after surgery.     °b. Remove any wound packing material at this tim,e as well.  You do not need to repack the wound unless instructed otherwise.  Wear an absorbent pad or soft cotton gauze in your underwear to catch any drainage and help keep the area clean. You should change this every 2-3 hours while awake. °c. Keep the area clean and dry.  Bathe / shower every day, especially after bowel movements.  Keep the area clean by showering / bathing over the incision / wound.   It is okay to soak an open wound to help wash it.  Wet wipes or showers / gentle washing after bowel movements is often less traumatic than regular toilet paper. °d. You may have some styrofoam-like soft packing in the rectum which will come out with the first bowel movement.  °e. You will often notice bleeding with bowel movements.  This should slow down by the end of the first week of surgery °f. Expect some drainage.  This should slow down, too, by the end of the first week of surgery.  Wear an absorbent pad or soft cotton gauze in your underwear until the drainage stops. °g. Do Not sit on a rubber or pillow ring.  This can make you symptoms worse.  You may sit on a soft pillow if needed.  °6. ACTIVITIES as tolerated:   °a. You may resume regular (light) daily activities beginning the next day--such as daily self-care, walking, climbing stairs--gradually increasing activities as tolerated.  If you can walk 30 minutes without difficulty, it is safe to try more intense activity such as jogging, treadmill, bicycling, low-impact aerobics, swimming, etc. °b. Save the most intensive and strenuous activity for last such as sit-ups, heavy lifting, contact sports,  etc  Refrain from any heavy lifting or straining until you are off narcotics for pain control.   °c. You may drive when you are no longer taking prescription pain medication, you can comfortably sit for long periods of time, and you can safely maneuver your car and apply brakes. °d. You may have sexual intercourse when it is comfortable.  °7. FOLLOW UP in our office °a. Please call [facility] at [phone] to set up an appointment to see your surgeon in the office for a follow-up appointment approximately 3-4 weeks after your surgery. °b. Make sure that you call for this appointment the day you arrive home to insure a convenient appointment time. °10. IF YOU HAVE DISABILITY OR FAMILY LEAVE FORMS, BRING THEM TO THE OFFICE FOR PROCESSING.  DO NOT GIVE THEM TO YOUR DOCTOR. ° ° ° ° °WHEN TO CALL US [phone]: °1. Poor pain control °  2. Reactions / problems with new medications (rash/itching, nausea, etc)  3. Fever over 101.5 F (38.5 C) 4. Inability to urinate 5. Nausea and/or vomiting 6. Worsening swelling or bruising 7. Continued bleeding from incision. 8. Increased pain, redness, or drainage from the incision  The clinic staff is available to answer your questions during regular business hours (8:30am-5pm).  Please dont hesitate to call and ask to speak to one of our nurses for clinical concerns.   [facility] is always on call at the hospitals   If you have a medical emergency, go to the nearest emergency room or call 911.    [facility] [address], [address], [place], [place]  [address] ? MAIN: (336) [phone] ? TOLL FREE: [phone] ? FAX [phone] [email or web address]      Post Anesthesia Home Care Instructions  Activity: Get plenty of rest for the remainder of the day. A responsible adult should stay with you for 24 hours following the procedure.  For the next 24 hours, DO NOT: -Drive a car -Social workerperate  machinery -Drink alcoholic beverages -Take any medication unless instructed by your physician -Make any legal decisions or sign important papers.  Meals: Start with liquid foods such as gelatin or soup. Progress to regular foods as tolerated. Avoid greasy, spicy, heavy foods. If nausea and/or vomiting occur, drink only clear liquids until the nausea and/or vomiting subsides. Call your physician if vomiting continues.  Special Instructions/Symptoms: Your throat may feel dry or sore from the anesthesia or the breathing tube placed in your throat during surgery. If this causes discomfort, gargle with warm salt water. The discomfort should disappear within 24 hours.  If you had a scopolamine patch placed behind your ear for the management of post- operative nausea and/or vomiting:  1. The medication in the patch is effective for 72 hours, after which it should be removed.  Wrap patch in a tissue and discard in the trash. Wash hands thoroughly with soap and water. 2. You may remove the patch earlier than 72 hours if you experience unpleasant side effects which may include dry mouth, dizziness or visual disturbances. 3. Avoid touching the patch. Wash your hands with soap and water after contact with the patch.

## 2014-12-22 NOTE — H&P (Signed)
Gavin Fowler 11/13/2014 1:56 PM Location: [facility] Patient #: [id number] DOB: 08-21-1951 Married / Language: [place] / Race: White Male  History of Present Illness Gavin Levee MD; 11/13/2014 4:18 PM) Patient words: recheck hems.  The patient is a 64 year old male who presents with hemorrhoids. Patient is here today to discuss bleeding prolapsed internal hemorrhoids. He reports he has rectal bleeding on a weekly basis. This occurs for several days in a row and then stops. He also reports prolapse on a daily basis requiring manual reduction. He is having soft bowel movements by eating bran cereal every morning. Any constipation or straining. The symptoms have been present for several years. I saw him 1 year ago recommended THD. He is undergoing surgery in March. He is coming in to discuss possible surgical correction of his hemorrhoids after that.   Other Problems Gavin Fowler, CMA; 11/13/2014 1:56 PM) Diverticulosis Gastroesophageal Reflux Disease Hemorrhoids Inguinal Hernia Prostate Cancer  Past Surgical History Gavin Fowler, CMA; 11/13/2014 1:56 PM) Colon Polyp Removal - Colonoscopy Laparoscopic Inguinal Hernia Surgery Right. Open Inguinal Hernia Surgery Bilateral. Prostate Surgery - Removal  Diagnostic Studies History Gavin Fowler, CMA; 11/13/2014 1:56 PM) Colonoscopy 1-5 years ago  Allergies Gavin Fowler, CMA; 11/13/2014 1:57 PM) No Known Drug Allergies02/22/2016  Medication History Gavin Fowler, CMA; 11/13/2014 1:58 PM) Pantoprazole Sodium (  Tablet DR, Oral) Active. Medications Reconciled  Social History Gavin Fowler, [place]; 11/13/2014 1:56 PM) Alcohol use Occasional alcohol use. Caffeine use Coffee. No drug use Tobacco use Former smoker.  Family History Gavin Fowler, [place]; 11/13/2014 1:56 PM) Breast Cancer Sister. Colon Cancer Father. Melanoma Father.  Review of Systems Gavin Fowler CMA; 11/13/2014 1:56 PM) General Not  Present- Appetite Loss, Chills, Fatigue, Fever, Night Sweats, Weight Gain and Weight Loss. Skin Not Present- Change in Wart/Mole, Dryness, Hives, Jaundice, New Lesions, Non-Healing Wounds, Rash and Ulcer. HEENT Not Present- Earache, Hearing Loss, Hoarseness, Nose Bleed, Oral Ulcers, Ringing in the Ears, Seasonal Allergies, Sinus Pain, Sore Throat, Visual Disturbances, Wears glasses/contact lenses and Yellow Eyes. Breast Not Present- Breast Mass, Breast Pain, Nipple Discharge and Skin Changes. Cardiovascular Not Present- Chest Pain, Difficulty Breathing Lying Down, Leg Cramps, Palpitations, Rapid Heart Rate, Shortness of Breath and Swelling of Extremities. Gastrointestinal Present- Hemorrhoids. Not Present- Abdominal Pain, Bloating, Bloody Stool, Change in Bowel Habits, Chronic diarrhea, Constipation, Difficulty Swallowing, Excessive gas, Gets full quickly at meals, Indigestion, Nausea, Rectal Pain and Vomiting. Male Genitourinary Present- Frequency, Impotence, Urgency and Urine Leakage. Not Present- Blood in Urine, Change in Urinary Stream, Nocturia and Painful Urination. Musculoskeletal Not Present- Back Pain, Joint Pain, Joint Stiffness, Muscle Pain, Muscle Weakness and Swelling of Extremities. Neurological Present- Headaches. Not Present- Decreased Memory, Fainting, Numbness, Seizures, Tingling, Tremor, Trouble walking and Weakness. Psychiatric Not Present- Anxiety, Bipolar, Change in Sleep Pattern, Depression, Fearful and Frequent crying. Endocrine Not Present- Cold Intolerance, Excessive Hunger, Hair Changes, Heat Intolerance, Hot flashes and New Diabetes. Hematology Not Present- Easy Bruising, Excessive bleeding, Gland problems, HIV and Persistent Infections.   BP 130/71 mmHg  Pulse 70  Temp(Src) 98.7 F (37.1 C) (Oral)  Resp 17  Ht  (1.956 m)  Wt 95.709 kg (211 lb)  BMI 25.02 kg/m2  SpO2 96%   Physical Exam Gavin Levee MD; 11/13/2014 4:19 PM) General Mental  Status-Alert. General Appearance-Consistent with stated age. Hydration-Well hydrated. Voice-Normal.  Head and Neck Head-normocephalic, atraumatic with no lesions or palpable masses. Trachea-midline. Thyroid Gland Characteristics - normal size and consistency.  Eye Eyeball - Bilateral-Extraocular movements intact.  Sclera/Conjunctiva - Bilateral-No scleral icterus.  Chest and Lung Exam Chest and lung exam reveals -quiet, even and easy respiratory effort with no use of accessory muscles and on auscultation, normal breath sounds, no adventitious sounds and normal vocal resonance. Inspection Chest Wall - Normal. Back - normal.  Cardiovascular Cardiovascular examination reveals -normal heart sounds, regular rate and rhythm with no murmurs and normal pedal pulses bilaterally.  Abdomen Inspection Inspection of the abdomen reveals - No Hernias. Palpation/Percussion Palpation and Percussion of the abdomen reveal - Soft, Non Tender, No Rebound tenderness, No Rigidity (guarding) and No hepatosplenomegaly. Auscultation Auscultation of the abdomen reveals - Bowel sounds normal.  Rectal Anorectal Exam External - skin tag. Internal - normal sphincter tone and internal hemorrhoids(Grade 3, right anterior).  Neurologic Neurologic evaluation reveals -alert and oriented x 3 with no impairment of recent or remote memory. Mental Status-Normal.  Musculoskeletal Global Assessment -Note: no gross deformities.  Normal Exam - Left-Upper Extremity Strength Normal and Lower Extremity Strength Normal. Normal Exam - Right-Upper Extremity Strength Normal and Lower Extremity Strength Normal.    Assessment & Plan Gavin Fowler(Gentri Guardado MD; 11/13/2014 2:51 PM) INTERNAL HEMORRHOIDS (455.0  K64.8) Story: Patient with grade 3 internal hemorrhoids causing bleeding and severe discomfort. I have recommended once again for him to undergo THD. We discussed this procedure in detail. I  have given him educational pamphlets on this as well. He is having a penile prosthesis placed in March. He would like to do the surgery after this. He will call the office when he is ready to schedule. We discussed the importance of keeping his bowel habits as regular as possible before and after surgery. Risk of the procedure were discussed. These include bleeding, recurrence and pain.

## 2014-12-22 NOTE — Transfer of Care (Signed)
Immediate Anesthesia Transfer of Care Note  Patient: Rosanne SackJohn H Kier  Procedure(s) Performed: Procedure(s): TRANSANAL HEMORRHOIDAL DEARTERIALIZATION (N/A)  Patient Location: PACU  Anesthesia Type:MAC  Level of Consciousness: awake, alert , oriented and patient cooperative  Airway & Oxygen Therapy: Patient Spontanous Breathing and Patient connected to nasal cannula oxygen  Post-op Assessment: Report given to RN and Post -op Vital signs reviewed and stable  Post vital signs: Reviewed and stable  Last Vitals:  Filed Vitals:   12/22/14 0632  BP: 130/71  Pulse: 70  Temp: 37.1 C  Resp: 17    Complications: No apparent anesthesia complications

## 2014-12-23 ENCOUNTER — Ambulatory Visit (INDEPENDENT_AMBULATORY_CARE_PROVIDER_SITE_OTHER): Payer: BLUE CROSS/BLUE SHIELD | Admitting: Family Medicine

## 2014-12-23 VITALS — BP 120/80 | HR 83 | Temp 98.2°F | Resp 16 | Ht 76.0 in | Wt 212.2 lb

## 2014-12-23 DIAGNOSIS — N139 Obstructive and reflux uropathy, unspecified: Secondary | ICD-10-CM

## 2014-12-23 NOTE — Addendum Note (Signed)
Addended by: Elvina SidleLAUENSTEIN, Nana Vastine on: 12/23/2014 02:31 PM   Modules accepted: Level of Service

## 2014-12-23 NOTE — Progress Notes (Addendum)
Patient ID: Gavin Fowler, male   DOB: [birth year], 64 y.o.   MRN: [id number]   This chart was scribed for Elvina Sidle, MD by Haywood Pao, ED Scribe at [facility].The patient was seen in exam room 05 and the patient's care was started at 1:51 PM.  Patient ID: Gavin Fowler MRN: [id number], DOB: 1951-04-08, 64 y.o. Date of Encounter: 12/23/2014  Primary Physician: Alva Garnet., MD  Chief Complaint:  Chief Complaint  Patient presents with   Unable to Void    S/P hemorrhoid surgery yesterday/Hx for Prostate Cancer   HPI:  Gavin Fowler is a 64 y.o. male who presents to [facility] complaining of unable to void since 2: AM today. Pt is status post hemorrhoid surgery. He has a history of prostate cancer. He denies dysuria.   Patient also notes that every time he has had any surgery in the lower abdomen area, he has had urinary obstructionRequiring indwelling Foley for several days.  Past Medical History  Diagnosis Date   GERD (gastroesophageal reflux disease)    Internal hemorrhoid, bleeding    History of prostate cancer     s/p  radical prostatectomy 08-01-2010--  followed by dr borden   History of diverticulitis of colon    Sigmoid diverticulosis    ED (erectile dysfunction)    SUI (stress urinary incontinence), male    DDD (degenerative disc disease), lumbar    History of colon polyps     2005--  benign   Pain in testis     post prostatectomy     Home Meds: Prior to Admission medications   Medication Sig Start Date End Date Taking? Authorizing Provider  diazepam (VALIUM) 5 MG tablet Take 1 tablet (5 mg total) by mouth every 6 (six) hours as needed (urinary retention). 12/22/14  Yes [name], MD  oxyCODONE (OXY IR/ROXICODONE) 5 MG immediate release tablet Take 1-2 tablets (5-10 mg total) by mouth every 4 (four) hours as needed. 12/22/14  Yes [name], MD  pantoprazole (PROTONIX) 40 MG tablet Take 40 mg by  mouth every morning.    Yes Historical Provider, MD  traMADol (ULTRAM) 50 MG tablet Take by mouth. 08/25/14  Yes Historical Provider, MD  Pseudoephedrine HCl (SUDAFED PO) Take by mouth as needed.    Historical Provider, MD    Allergies: No Known Allergies  History   Social History   Marital Status: Married    Spouse Name: N/A   Number of Children: N/A   Years of Education: N/A   Occupational History   attorney    Social History Main Topics   Smoking status: Former Smoker -- 25 years    Types: Pipe, Cigars    Quit date: 09/22/1990   Smokeless tobacco: Never Used   Alcohol Use: Yes     Comment: 3 drinks/week   Drug Use: No   Sexual Activity: Not on file   Other Topics Concern   Not on file   Social History Narrative    Review of Systems: Constitutional: negative for chills, fever, night sweats, weight changes, or fatigue  HEENT: negative for vision changes, hearing loss, congestion, rhinorrhea, ST, epistaxis, or sinus pressure Cardiovascular: negative for chest pain or palpitations Respiratory: negative for hemoptysis, wheezing, shortness of breath, or cough Abdominal: negative for abdominal pain, nausea, vomiting, diarrhea, or constipation Dermatological: negative for rash Neurologic: negative for headache, dizziness, or syncope All other systems reviewed and are otherwise negative with the exception  to those above and in the HPI.  Physical Exam: Blood pressure 120/80, pulse 83, temperature 98.2 F (36.8 C), temperature source Oral, resp. rate 16, height 6\' 4"  (1.93 m), weight 212 lb 4 oz (96.276 kg), SpO2 99 %., Body mass index is 25.85 kg/(m^2). General: Well developed, well nourished, in no acute distress. Head: Normocephalic, atraumatic, eyes without discharge, sclera non-icteric, nares are without discharge. Bilateral auditory canals clear, TM's are without perforation, pearly grey and translucent with reflective cone of light bilaterally. Oral cavity  moist, posterior pharynx without exudate, erythema, peritonsillar abscess, or post nasal drip.  Neck: Supple. No thyromegaly. Full ROM. No lymphadenopathy. Lungs: Clear bilaterally to auscultation without wheezes, rales, or rhonchi. Breathing is unlabored. Heart: RRR with S1 S2. No murmurs, rubs, or gallops appreciated. Abdomen: Soft, with suprapubic tenderness and fullness Msk:  Strength and tone normal for age. Extremities/Skin: Warm and dry. No clubbing or cyanosis. No edema. No rashes or suspicious lesions. Neuro: Alert and oriented X 3. Moves all extremities spontaneously. Gait is normal. CNII-XII grossly in tact. Psych:  Responds to questions appropriately with a normal affect.   Foley inserted, 1800 cc urine drained and foley left in place.  Good relief.  ASSESSMENT AND PLAN:  64 y.o. year old male with  This chart was scribed in my presence and reviewed by me personally.    ICD-9-CM ICD-10-CM   1. Acute urinary obstruction 599.60 N13.9    Recheck Monday   Signed, Elvina SidleKurt Lauenstein, MD    Signed, Elvina SidleKurt Lauenstein, MD 12/23/2014 1:51 PM

## 2014-12-23 NOTE — Patient Instructions (Signed)
Foley Catheter Care °A Foley catheter is a soft, flexible tube that is placed into the bladder to drain urine. A Foley catheter may be inserted if: °· You leak urine or are not able to control when you urinate (urinary incontinence). °· You are not able to urinate when you need to (urinary retention). °· You had prostate surgery or surgery on the genitals. °· You have certain medical conditions, such as multiple sclerosis, dementia, or a spinal cord injury. °If you are going home with a Foley catheter in place, follow the instructions below. °TAKING CARE OF THE CATHETER °1. Wash your hands with soap and water. °2. Using mild soap and warm water on a clean washcloth: °· Clean the area on your body closest to the catheter insertion site using a circular motion, moving away from the catheter. Never wipe toward the catheter because this could sweep bacteria up into the urethra and cause infection. °· Remove all traces of soap. Pat the area dry with a clean towel. For males, reposition the foreskin. °3. Attach the catheter to your leg so there is no tension on the catheter. Use adhesive tape or a leg strap. If you are using adhesive tape, remove any sticky residue left behind by the previous tape you used. °4. Keep the drainage bag below the level of the bladder, but keep it off the floor. °5. Check throughout the day to be sure the catheter is working and urine is draining freely. Make sure the tubing does not become kinked. °6. Do not pull on the catheter or try to remove it. Pulling could damage internal tissues. °TAKING CARE OF THE DRAINAGE BAGS °You will be given two drainage bags to take home. One is a large overnight drainage bag, and the other is a smaller leg bag that fits underneath clothing. You may wear the overnight bag at any time, but you should never wear the smaller leg bag at night. Follow the instructions below for how to empty, change, and clean your drainage bags. °Emptying the Drainage Bag °You must  empty your drainage bag when it is  -½ full or at least 2-3 times a day. °1. Wash your hands with soap and water. °2. Keep the drainage bag below your hips, below the level of your bladder. This stops urine from going back into the tubing and into your bladder. °3. Hold the dirty bag over the toilet or a clean container. °4. Open the pour spout at the bottom of the bag and empty the urine into the toilet or container. Do not let the pour spout touch the toilet, container, or any other surface. Doing so can place bacteria on the bag, which can cause an infection. °5. Clean the pour spout with a gauze pad or cotton ball that has rubbing alcohol on it. °6. Close the pour spout. °7. Attach the bag to your leg with adhesive tape or a leg strap. °8. Wash your hands well. °Changing the Drainage Bag °Change your drainage bag once a month or sooner if it starts to smell bad or look dirty. Below are steps to follow when changing the drainage bag. °1. Wash your hands with soap and water. °2. Pinch off the rubber catheter so that urine does not spill out. °3. Disconnect the catheter tube from the drainage tube at the connection valve. Do not let the tubes touch any surface. °4. Clean the end of the catheter tube with an alcohol wipe. Use a different alcohol wipe to clean the   end of the drainage tube. °5. Connect the catheter tube to the drainage tube of the clean drainage bag. °6. Attach the new bag to the leg with adhesive tape or a leg strap. Avoid attaching the new bag too tightly. °7. Wash your hands well. °Cleaning the Drainage Bag °1. Wash your hands with soap and water. °2. Wash the bag in warm, soapy water. °3. Rinse the bag thoroughly with warm water. °4. Fill the bag with a solution of white vinegar and water (1 cup vinegar to 1 qt warm water [.2 L vinegar to 1 L warm water]). Close the bag and soak it for 30 minutes in the solution. °5. Rinse the bag with warm water. °6. Hang the bag to dry with the pour spout open  and hanging downward. °7. Store the clean bag (once it is dry) in a clean plastic bag. °8. Wash your hands well. °PREVENTING INFECTION °· Wash your hands before and after handling your catheter. °· Take showers daily and wash the area where the catheter enters your body. Do not take baths. Replace wet leg straps with dry ones, if this applies. °· Do not use powders, sprays, or lotions on the genital area. Only use creams, lotions, or ointments as directed by your caregiver. °· For females, wipe from front to back after each bowel movement. °· Drink enough fluids to keep your urine clear or pale yellow unless you have a fluid restriction. °· Do not let the drainage bag or tubing touch or lie on the floor. °· Wear cotton underwear to absorb moisture and to keep your skin drier. °SEEK MEDICAL CARE IF:  °· Your urine is cloudy or smells unusually bad. °· Your catheter becomes clogged. °· You are not draining urine into the bag or your bladder feels full. °· Your catheter starts to leak. °SEEK IMMEDIATE MEDICAL CARE IF:  °· You have pain, swelling, redness, or pus where the catheter enters the body. °· You have pain in the abdomen, legs, lower back, or bladder. °· You have a fever. °· You see blood fill the catheter, or your urine is pink or red. °· You have nausea, vomiting, or chills. °· Your catheter gets pulled out. °MAKE SURE YOU:  °· Understand these instructions. °· Will watch your condition. °· Will get help right away if you are not doing well or get worse. °Document Released: 09/08/2005 Document Revised: 01/23/2014 Document Reviewed: 08/30/2012 °Exit[employer]® Patient Information ©2015 [employer]. This information is not intended to replace advice given to you by your health care provider. Make sure you discuss any questions you have with your health care provider. ° °

## 2014-12-25 ENCOUNTER — Encounter (HOSPITAL_BASED_OUTPATIENT_CLINIC_OR_DEPARTMENT_OTHER): Payer: Self-pay | Admitting: General Surgery

## 2014-12-25 ENCOUNTER — Ambulatory Visit (INDEPENDENT_AMBULATORY_CARE_PROVIDER_SITE_OTHER): Payer: BLUE CROSS/BLUE SHIELD | Admitting: Family Medicine

## 2014-12-25 VITALS — BP 118/70 | HR 88 | Temp 98.6°F | Resp 16 | Wt 208.0 lb

## 2014-12-25 DIAGNOSIS — N139 Obstructive and reflux uropathy, unspecified: Secondary | ICD-10-CM

## 2014-12-25 MED ORDER — CIPROFLOXACIN HCL 250 MG PO TABS: 500.0000 mg | ORAL_TABLET | Freq: Two times a day (BID) | ORAL | Status: DC

## 2014-12-25 MED ORDER — CIPROFLOXACIN HCL 250 MG PO TABS
500.0000 mg | ORAL_TABLET | Freq: Once | ORAL | Status: AC
  Administered 2014-12-25: 500 mg via ORAL

## 2014-12-25 NOTE — Progress Notes (Signed)
° °  Subjective:    Patient ID: Gavin SackJohn H Karel, male    DOB: [birth year], 64 y.o.   MRN: [id number] This chart was scribed for Elvina SidleKurt Lauenstein, MD by Littie Deedsichard Sun, Medical Scribe. This patient was seen in Room 14 and the patient's care was started at 1:27 PM.   HPI HPI Comments: Gavin Fowler is a 64 y.o. male with a hx of hemorrhoid prolapse and prostate cancer who presents to the [facility] for a follow-up. He is here to have his catheter removed. Patient was seen by me 2 days ago for inability to void. He notes his symptoms have improved some, but he still has some rectal pain. Patient had been prescribed valium following his hemorrhoid surgery. NKDA.  My note from 12/23/14: Gavin SackJohn H Candee is a 64 y.o. male who presents to [facility] complaining of unable to void since 2: AM today. Pt is status post hemorrhoid surgery. He has a history of prostate cancer. He denies dysuria.  Patient also notes that every time he has had any surgery in the lower abdomen area, he has had urinary obstruction Requiring indwelling Foley for several days.   Review of Systems  Gastrointestinal: Positive for rectal pain.  Genitourinary: Positive for difficulty urinating.       Objective:   Physical Exam CONSTITUTIONAL: Well developed/well nourished HEAD: Normocephalic/atraumatic EYES: EOM/PERRL ENMT: Mucous membranes moist NECK: supple no meningeal signs SPINE: entire spine nontender CV: S1/S2 noted, no murmurs/rubs/gallops noted LUNGS: Lungs are clear to auscultation bilaterally, no apparent distress ABDOMEN: soft, nontender, no rebound or guarding GU: no cva tenderness NEURO: Pt is awake/alert, moves all extremitiesx4 EXTREMITIES: pulses normal, full ROM SKIN: warm, color normal PSYCH: no abnormalities of mood noted  Normal genitalia Catheter removed without incident     Assessment & Plan:   This chart was scribed in my presence and reviewed by me  personally.    ICD-9-CM ICD-10-CM   1. Urinary obstruction 599.60 N13.9 ciprofloxacin (CIPRO) tablet 500 mg     DISCONTINUED: ciprofloxacin (CIPRO) tablet 500 mg     Signed, Elvina SidleKurt Lauenstein, MD

## 2015-12-12 ENCOUNTER — Other Ambulatory Visit: Payer: Self-pay | Admitting: Internal Medicine

## 2015-12-12 DIAGNOSIS — Q5522 Retractile testis: Secondary | ICD-10-CM

## 2015-12-21 ENCOUNTER — Ambulatory Visit
Admission: RE | Admit: 2015-12-21 | Discharge: 2015-12-21 | Disposition: A | Discharge status: Home / Self Care | Payer: BLUE CROSS/BLUE SHIELD | Source: Ambulatory Visit | Attending: Internal Medicine | Admitting: Internal Medicine

## 2015-12-21 DIAGNOSIS — Q5522 Retractile testis: Secondary | ICD-10-CM

## 2017-05-05 ENCOUNTER — Encounter: Payer: Self-pay | Admitting: Neurology

## 2017-05-05 ENCOUNTER — Ambulatory Visit (INDEPENDENT_AMBULATORY_CARE_PROVIDER_SITE_OTHER): Payer: PRIVATE HEALTH INSURANCE | Admitting: Neurology

## 2017-05-05 DIAGNOSIS — G609 Hereditary and idiopathic neuropathy, unspecified: Secondary | ICD-10-CM | POA: Diagnosis not present

## 2017-05-05 DIAGNOSIS — G629 Polyneuropathy, unspecified: Secondary | ICD-10-CM | POA: Insufficient documentation

## 2017-05-05 DIAGNOSIS — IMO0002 Reserved for concepts with insufficient information to code with codable children: Secondary | ICD-10-CM | POA: Insufficient documentation

## 2017-05-05 DIAGNOSIS — G43709 Chronic migraine without aura, not intractable, without status migrainosus: Secondary | ICD-10-CM | POA: Diagnosis not present

## 2017-05-05 NOTE — Progress Notes (Signed)
PATIENT: Gavin SackJohn H Fowler DOB: 08-15-1951  Chief Complaint  Patient presents with  . Paresthesia of skin    Reports burning, numbness and tingling in his bilateral feet.  Marland Kitchen. PCP    Andi DevonShelton, Kimberly, MD     HISTORICAL  Gavin Fowler 66 years old right-handed male, seen in refer by his primary care doctor  Andi DevonShelton, Kimberly for evaluation of burning and tingling of bilateral feet, initial evaluation was May 05 2017.  I reviewed and summarized the referring note, he had a history of prostate cancer in October 2011, had surgery with Dr. Laverle PatterBorden, with side effect of erectile dysfunction, bladder incontinence, he has tried Trimex (combination of papaverin, phentolamine, prostaglandin E1 mix),  but experienced a painful priapism, was treated at versions the room in 2012.  He reported strong reaction to the medication during treatment, with significantly elevated blood pressure more than 200, with explosive severe headache,  I personally reviewed MRI brain in 2012 that was normal,  Since the event, he has constant headache,variable degree,he has been taking Sudafed frequently since 1980s following his previous sinus surgery, he described it as a sinus headache, bilateral retro-orbital,facial pressure pain,  Now he has frequent low-grade left temporal region pressure pain, he has been taking Sudafed 120 mg up to 3 times a week to control the symptoms, but few times each week, he has more typical migraine headache, proceeded by flashlight, zigzag line in his visual field, lasting for 10 minutes, followed by moderate headache with light noise sensitivity,  He also complains that since his prostate surgery in 2011, he noticed constant right groin area deep pressure pain,  he also noticed gradual onset bilateral lower extremity paresthesia, initially it was at toes, since 2018, it spread to bilateral plantar surface,he denies gait difficulty  MRI of lumbar in April 2012, multilevel degenerative  disc disease, there was no significant canal or foraminal narrowing.  Laboratory evaluations, hepatitis C antibody was negative, C-reactive protein 1.1 2, vitamin D level 27.8, normal hemoglobin 16.2, creatinine 0.94, normal CMP, LDL was elevated 132,  REVIEW OF SYSTEMS: Full 14 system review of systems performed and notable only for easy bruising, headache, numbness, incontinence, frequent infection  ALLERGIES: No Known Allergies  HOME MEDICATIONS: Current Outpatient Prescriptions  Medication Sig Dispense Refill  . Feverfew 380 MG CAPS Take 2 capsules by mouth daily.    . pantoprazole (PROTONIX) 40 MG tablet Take 40 mg by mouth every morning.     . Pseudoephedrine HCl (SUDAFED PO) Take 120 mg by mouth as needed.     . traMADol (ULTRAM) 50 MG tablet Take 50 mg by mouth as needed.      No current facility-administered medications for this visit.     PAST MEDICAL HISTORY: Past Medical History:  Diagnosis Date  . Chronic sinusitis   . DDD (degenerative disc disease), lumbar   . ED (erectile dysfunction)   . GERD (gastroesophageal reflux disease)   . History of colon polyps    2005--  benign  . History of diverticulitis of colon   . History of prostate cancer    s/p  radical prostatectomy 08-01-2010--  followed by dr borden  . Internal hemorrhoid, bleeding   . Pain in testis    post prostatectomy  . Sigmoid diverticulosis   . SUI (stress urinary incontinence), male     PAST SURGICAL HISTORY: Past Surgical History:  Procedure Laterality Date  . CATARACT EXTRACTION W/ INTRAOCULAR LENS IMPLANT Right 2012  . COLONOSCOPY  last one 2014  . ETHMOIDECTOMY  1989  . HEMORROIDECTOMY    . INGUINAL HERNIA REPAIR Bilateral left  02-25-2007//   right -- as infant  . RETINAL DETACHMENT SURGERY Right 2012  . ROBOT ASSISTED LAPAROSCOPIC RADICAL PROSTATECTOMY  08-01-2010   and RIGHT INGUINAL HERNIA REPAIR  . TRANSANAL HEMORRHOIDAL DEARTERIALIZATION N/A 12/22/2014   Procedure: TRANSANAL  HEMORRHOIDAL DEARTERIALIZATION;  Surgeon: [name], MD;  Location: [facility];  Service: General;  Laterality: N/A;    FAMILY HISTORY: Family History  Problem Relation Age of Onset  . Colon cancer Father 86  . Breast cancer Sister     SOCIAL HISTORY:  Social History   Social History  . Marital status: Married    Spouse name: N/A  . Number of children: 8  . Years of education: College   Occupational History  . attorney    Social History Main Topics  . Smoking status: Former Smoker    Years: 25.00    Types: Pipe, Cigars    Quit date: 09/22/1990  . Smokeless tobacco: Never Used  . Alcohol use Yes     Comment: 3 drinks/week  . Drug use: No  . Sexual activity: Not on file   Other Topics Concern  . Not on file   Social History Narrative   Lives at home with his wife.   Right-handed.   1 cup coffee per day.     PHYSICAL EXAM   Vitals:   05/05/17 1045  BP: (!) 145/90  Pulse: 91  Weight: 214 lb (97.1 kg)  Height: 6\' 4"  (1.93 m)    Not recorded      Body mass index is 26.05 kg/m.  PHYSICAL EXAMNIATION:  Gen: NAD, conversant, well nourised, obese, well groomed                     Cardiovascular: Regular rate rhythm, no peripheral edema, warm, nontender. Eyes: Conjunctivae clear without exudates or hemorrhage Neck: Supple, no carotid bruits. Pulmonary: Clear to auscultation bilaterally   NEUROLOGICAL EXAM:  MENTAL STATUS: Speech:    Speech is normal; fluent and spontaneous with normal comprehension.  Cognition:     Orientation to time, place and person     Normal recent and remote memory     Normal Attention span and concentration     Normal Language, naming, repeating,spontaneous speech     Fund of knowledge   CRANIAL NERVES: CN II: Visual fields are full to confrontation. Fundoscopic exam is normal with sharp discs and no vascular changes. Pupils are round equal and briskly reactive to light. CN III, IV, VI: extraocular  movement are normal. No ptosis. CN V: Facial sensation is intact to pinprick in all 3 divisions bilaterally. Corneal responses are intact.  CN VII: Face is symmetric with normal eye closure and smile. CN VIII: Hearing is normal to rubbing fingers CN IX, X: Palate elevates symmetrically. Phonation is normal. CN XI: Head turning and shoulder shrug are intact CN XII: Tongue is midline with normal movements and no atrophy.  MOTOR: There is no pronator drift of out-stretched arms. Muscle bulk and tone are normal. Muscle strength is normal.  REFLEXES: Reflexes are 2+ and symmetric at the biceps, triceps, knees, and ankles. Plantar responses are flexor.  SENSORY: Mildly length dependent decreased to light touch, pinprick to mid foot, and decreased  vibratory sensation at toes  COORDINATION: Rapid alternating movements and fine finger movements are intact. There is no dysmetria on finger-to-nose and heel-knee-shin.  GAIT/STANCE: Posture is normal. Gait is steady with normal steps, base, arm swing, and turning. Heel and toe walking are normal. Tandem gait is normal.  Romberg is absent.   DIAGNOSTIC DATA (LABS, IMAGING, TESTING) - I reviewed patient records, labs, notes, testing and imaging myself where available.   ASSESSMENT AND PLAN  BURCH MARCHUK is a 66 y.o. male    Chronic migraine with visual aura  Likely a component of Sudafed rebound headache  I have advised him stop taking Sudafed  Tramadol as needed  Bilateral lower extremity paresthesia  Differentiation diagnosis including peripheral neuropathy  Laboratory evaluation for etiology  EMG nerve conduction study , Levert Feinstein, M.D. Ph.D.  [facility] [address] [place], [place] [address] Ph: [phone] Fax: [phone]  CC: Andi Devon, MD

## 2017-05-07 LAB — COMPREHENSIVE METABOLIC PANEL
ALBUMIN: 4.5 g/dL (ref 3.6–4.8)
ALT: 26 IU/L (ref 0–44)
AST: 23 IU/L (ref 0–40)
Albumin/Globulin Ratio: 2.1 (ref 1.2–2.2)
Alkaline Phosphatase: 69 IU/L (ref 39–117)
BUN / CREAT RATIO: 12 (ref 10–24)
BUN: 12 mg/dL (ref 8–27)
Bilirubin Total: 0.6 mg/dL (ref 0.0–1.2)
CALCIUM: 9.1 mg/dL (ref 8.6–10.2)
CO2: 25 mmol/L (ref 20–29)
Chloride: 106 mmol/L (ref 96–106)
Creatinine, Ser: 0.97 mg/dL (ref 0.76–1.27)
GFR calc non Af Amer: 83 mL/min/{1.73_m2} (ref >59)
GFR, EST AFRICAN AMERICAN: 96 mL/min/{1.73_m2} (ref >59)
Globulin, Total: 2.1 g/dL (ref 1.5–4.5)
Glucose: 103 mg/dL — ABNORMAL HIGH (ref 65–99)
Potassium: 4.6 mmol/L (ref 3.5–5.2)
Sodium: 143 mmol/L (ref 134–144)

## 2017-05-07 LAB — VITAMIN B12: Vitamin B-12: 495 pg/mL (ref 232–1245)

## 2017-05-07 LAB — CBC WITH DIFFERENTIAL
BASOS: 0 %
Basophils Absolute: 0 10*3/uL (ref 0.0–0.2)
EOS (ABSOLUTE): 0.1 10*3/uL (ref 0.0–0.4)
EOS: 1 %
HEMATOCRIT: 48.3 % (ref 37.5–51.0)
Hemoglobin: 15.9 g/dL (ref 13.0–17.7)
IMMATURE GRANS (ABS): 0 10*3/uL (ref 0.0–0.1)
IMMATURE GRANULOCYTES: 0 %
LYMPHS ABS: 1.1 10*3/uL (ref 0.7–3.1)
Lymphs: 15 %
MCH: 30.7 pg (ref 26.6–33.0)
MCHC: 32.9 g/dL (ref 31.5–35.7)
MCV: 93 fL (ref 79–97)
MONOS ABS: 0.5 10*3/uL (ref 0.1–0.9)
Monocytes: 7 %
NEUTROS ABS: 5.4 10*3/uL (ref 1.4–7.0)
Neutrophils: 77 %
RBC: 5.18 x10E6/uL (ref 4.14–5.80)
RDW: 14.3 % (ref 12.3–15.4)
WBC: 7.2 10*3/uL (ref 3.4–10.8)

## 2017-05-07 LAB — VITAMIN D 25 HYDROXY (VIT D DEFICIENCY, FRACTURES): VIT D 25 HYDROXY: 28.7 ng/mL — AB (ref 30.0–100.0)

## 2017-05-07 LAB — IMMUNOFIXATION ELECTROPHORESIS
IgA/Immunoglobulin A, Serum: 94 mg/dL (ref 61–437)
IgG (Immunoglobin G), Serum: 732 mg/dL (ref 700–1600)
IgM (Immunoglobulin M), Srm: 35 mg/dL (ref 20–172)
Total Protein: 6.6 g/dL (ref 6.0–8.5)

## 2017-05-07 LAB — C-REACTIVE PROTEIN: CRP: 1.2 mg/L (ref 0.0–4.9)

## 2017-05-07 LAB — RPR: RPR: NONREACTIVE

## 2017-05-07 LAB — HGB A1C W/O EAG: Hgb A1c MFr Bld: 5.3 % (ref 4.8–5.6)

## 2017-05-07 LAB — CK: Total CK: 141 U/L (ref 24–204)

## 2017-05-07 LAB — SEDIMENTATION RATE: Sed Rate: 2 mm/hr (ref 0–30)

## 2017-05-07 LAB — TSH: TSH: 2.08 u[IU]/mL (ref 0.450–4.500)

## 2017-05-07 LAB — ANA W/REFLEX: Anti Nuclear Antibody(ANA): NEGATIVE

## 2017-05-11 ENCOUNTER — Telehealth: Payer: Self-pay | Admitting: Neurology

## 2017-05-11 ENCOUNTER — Encounter: Payer: Self-pay | Admitting: *Deleted

## 2017-05-11 NOTE — Telephone Encounter (Signed)
Patient aware of results and agreeable to start the recommended supplement.

## 2017-05-11 NOTE — Telephone Encounter (Signed)
I have reviewed laboratory evaluation, the only abnormalities mildly decreased vitamin D 28, he should take vitamin D3 supplement 1000 units daily.

## 2017-05-20 ENCOUNTER — Ambulatory Visit (INDEPENDENT_AMBULATORY_CARE_PROVIDER_SITE_OTHER): Payer: PRIVATE HEALTH INSURANCE | Admitting: Neurology

## 2017-05-20 DIAGNOSIS — G609 Hereditary and idiopathic neuropathy, unspecified: Secondary | ICD-10-CM | POA: Diagnosis not present

## 2017-05-20 DIAGNOSIS — G43709 Chronic migraine without aura, not intractable, without status migrainosus: Secondary | ICD-10-CM

## 2017-05-20 DIAGNOSIS — IMO0002 Reserved for concepts with insufficient information to code with codable children: Secondary | ICD-10-CM

## 2017-05-20 MED ORDER — GABAPENTIN 100 MG PO CAPS: 100.0000 mg | ORAL_CAPSULE | Freq: Three times a day (TID) | ORAL | 11 refills | Status: DC

## 2017-05-20 MED ORDER — RIZATRIPTAN BENZOATE 5 MG PO TBDP: 5.0000 mg | ORAL_TABLET | ORAL | 11 refills | Status: AC | PRN

## 2017-05-20 NOTE — Procedures (Signed)
Full Name: Daune PerchJohn Demetrius Gender: Male MRN #:   [id number] Date of Birth: October 19, 1950    Visit Date: 05/20/2017 08:03 Age: [age] Years 10 Months Old Examining Physician: Levert FeinsteinYijun Brucha Ahlquist, MD  Referring Physician: Terrace ArabiaYan, MD History:  66 year old male with progressive bilateral feet and lower extremity paresthesia  Summary of the test: Nerve conduction study: Bilateral lower extremity sensory and motor conduction studies were normal.  Electromyography: Selective needle examination of bilateral upper and lower extremity muscles were normal  Conclusion: This is a normal study. There is no electrodiagnostic evidence of large fiber peripheral neuropathy or bilateral lumbar sacral radiculopathy.    ------------------------------- Levert FeinsteinYijun Annora Guderian, M.D.  [facility] [address] [place], [place] [address] Tel: [phone] Fax: [phone]        [facility]    Nerve / Sites Muscle Latency Ref. Amplitude Ref. Rel Amp Segments Distance Velocity Ref. Area    ms ms mV mV %  cm m/s m/s mVms  R Peroneal - EDB     Ankle EDB 4.6 ?6.5 3.2 ?2.0 100 Ankle - EDB 9   12.7     Fib head EDB 13.9  3.0  94.9 Fib head - Ankle 42 45 ?44 14.6     Pop fossa EDB 16.5  3.5  117 Pop fossa - Fib head 12 45 ?44 16.1         Pop fossa - Ankle      L Peroneal - EDB     Ankle EDB 5.4 ?6.5 5.0 ?2.0 100 Ankle - EDB 9   19.0     Fib head EDB 15.2  4.4  87.6 Fib head - Ankle 43 44 ?44 18.7     Pop fossa EDB 17.8  5.2  120 Pop fossa - Fib head 12 45 ?44 22.1         Pop fossa - Ankle      R Tibial - AH     Ankle AH 4.3 ?5.8 5.0 ?4.0 100 Ankle - AH 9   15.9     Pop fossa AH 16.5  3.0  60.7 Pop fossa - Ankle 50 41 ?41 12.3  L Tibial - AH     Ankle AH 5.1 ?5.8 4.1 ?4.0 100 Ankle - AH 9   15.6     Pop fossa AH 17.4  3.3  80.3 Pop fossa - Ankle 50 41 ?41 16.0             SNC    Nerve / Sites Rec. Site Peak Lat Ref.  Amp Ref. Segments Distance    ms ms V V  cm  L Sural - Ankle (Calf)     Calf Ankle 3.8  ?4.4 7 ?6 Calf - Ankle 14  R Sural - Ankle (Calf)     Calf Ankle 3.8 ?4.4 6 ?6 Calf - Ankle 14  R Superficial peroneal - Ankle     Lat leg Ankle 4.2 ?4.4 6 ?6 Lat leg - Ankle 14  L Superficial peroneal - Ankle     Lat leg Ankle 4.4 ?4.4 8 ?6 Lat leg - Ankle 14             F  Wave    Nerve F Lat Ref.   ms ms  R Tibial - AH 58.2 ?56.0  L Tibial - AH 58.9 ?56.0         H Reflex    Nerve H Lat Lat Hmax   ms  ms   Left Right Ref. Left Right Ref.  Tibial - Soleus 38.1 38.2 ?35.0 39.3 19.8 ?35.0         EMG full       EMG Summary Table    Spontaneous MUAP Recruitment  Muscle IA Fib PSW Fasc Other Amp Dur. Poly Pattern  R. Tibialis anterior Normal None None None _______ Normal Normal Normal Normal  R. Tibialis posterior Normal None None None _______ Normal Normal Normal Normal  R. Vastus lateralis Normal None None None _______ Normal Normal Normal Normal  R. Abductor hallucis Normal None None None _______ Normal Normal Normal Normal  L. Tibialis anterior Normal None None None _______ Normal Normal Normal Normal  L. Tibialis posterior Normal None None None _______ Normal Normal Normal Normal  L. Vastus lateralis Normal None None None _______ Normal Normal Normal Normal  R. Lumbar paraspinals (mid) Normal None None None _______ Normal Normal Normal Normal  R. Lumbar paraspinals (low) Normal None None None _______ Normal Normal Normal Normal  L. Lumbar paraspinals (mid) Normal None None None _______ Normal Normal Normal Normal  L. Lumbar paraspinals (low) Normal None None None _______ Normal Normal Normal Normal

## 2017-05-20 NOTE — Progress Notes (Signed)
PATIENT: Gavin Fowler DOB: Apr 23, 1951  HISTORICAL  Gavin Fowler 66 years old right-handed male, seen in refer by his primary care doctor  Willey Blade for evaluation of burning and tingling of bilateral feet, initial evaluation was May 05 2017.  I reviewed and summarized the referring note, he had a history of prostate cancer in October 2011, had surgery with Dr. Alinda Money, with side effect of erectile dysfunction, bladder incontinence, he has tried Trimex (combination of papaverin, phentolamine, prostaglandin E1 mix),  but experienced a painful priapism, was treated at emergency room in 2012.  He reported strong reaction to the medication during treatment, with significantly elevated blood pressure more than 200, with explosive severe headache,  I personally reviewed MRI brain in 2012 that was normal,  Since the event, he has constant headache,variable degree,he has been taking Sudafed frequently since 1980s following his previous sinus surgery, he described it as a sinus headache, bilateral retro-orbital,facial pressure pain,  Now he has frequent low-grade left temporal region pressure pain, he has been taking Sudafed 120 mg up to 3 times a week to control the symptoms, but few times each week, he has more typical migraine headache, proceeded by flashlight, zigzag line in his visual field, lasting for 10 minutes, followed by moderate headache with light noise sensitivity,  He also complains that since his prostate surgery in 2011, he noticed constant right groin area deep pressure pain,  he also noticed gradual onset bilateral lower extremity paresthesia, initially it was at toes, since 2018, it spread to bilateral plantar surface,he denies gait difficulty  MRI of lumbar in April 2012, multilevel degenerative disc disease, there was no significant canal or foraminal narrowing.  Laboratory evaluations, hepatitis C antibody was negative, C-reactive protein 1.1 2, vitamin D level  27.8, normal hemoglobin 16.2, creatinine 0.94, normal CMP, LDL was elevated 132,  UPDATE May 20 2017: Reviewed laboratory evaluation in August 2018, A1c was 5.3, normal CBC, with hemoglobin of 15.9, CMP, with normal creatinine 0.97, protein electrophoresis, negative RPR, normal ESR, C-reactive protein, TSH, CPK, vitamin B12, vitamin D was mildly decreased at 28.  He continue complains of typical migraine at least twice a month, preceded by visual aura, followed by left-sided headache, he also has a long history of sinus issues, is going to see his ENT at the [facility] soon.  EMG nerve conduction study today is normal, there is no evidence of large size fiber peripheral neuropathy   REVIEW OF SYSTEMS: Full 14 system review of systems performed and notable only for as above   ALLERGIES: No Known Allergies  HOME MEDICATIONS: Current Outpatient Prescriptions  Medication Sig Dispense Refill  . Cholecalciferol (VITAMIN D3) 1000 units CAPS Take 1,000 Units by mouth daily.    . Feverfew 380 MG CAPS Take 2 capsules by mouth daily.    . pantoprazole (PROTONIX) 40 MG tablet Take 40 mg by mouth every morning.     . Pseudoephedrine HCl (SUDAFED PO) Take 120 mg by mouth as needed.     . traMADol (ULTRAM) 50 MG tablet Take 50 mg by mouth as needed.      No current facility-administered medications for this visit.     PAST MEDICAL HISTORY: Past Medical History:  Diagnosis Date  . Chronic sinusitis   . DDD (degenerative disc disease), lumbar   . ED (erectile dysfunction)   . GERD (gastroesophageal reflux disease)   . History of colon polyps    2005--  benign  . History of diverticulitis  of colon   . History of prostate cancer    s/p  radical prostatectomy 08-01-2010--  followed by dr borden  . Internal hemorrhoid, bleeding   . Pain in testis    post prostatectomy  . Sigmoid diverticulosis   . SUI (stress urinary incontinence), male     PAST SURGICAL HISTORY: Past Surgical  History:  Procedure Laterality Date  . CATARACT EXTRACTION W/ INTRAOCULAR LENS IMPLANT Right 2012  . COLONOSCOPY  last one 2014  . ETHMOIDECTOMY  1989  . HEMORROIDECTOMY    . INGUINAL HERNIA REPAIR Bilateral left  02-25-2007//   right -- as infant  . RETINAL DETACHMENT SURGERY Right 2012  . ROBOT ASSISTED LAPAROSCOPIC RADICAL PROSTATECTOMY  08-01-2010   and RIGHT INGUINAL HERNIA REPAIR  . TRANSANAL HEMORRHOIDAL DEARTERIALIZATION N/A 12/22/2014   Procedure: TRANSANAL HEMORRHOIDAL DEARTERIALIZATION;  Surgeon: Leighton Ruff, MD;  Location: [facility];  Service: General;  Laterality: N/A;    FAMILY HISTORY: Family History  Problem Relation Age of Onset  . Colon cancer Father 48  . Breast cancer Sister     SOCIAL HISTORY:  Social History   Social History  . Marital status: Married    Spouse name: N/A  . Number of children: 8  . Years of education: College   Occupational History  . attorney    Social History Main Topics  . Smoking status: Former Smoker    Years: 25.00    Types: Pipe, Cigars    Quit date: 09/22/1990  . Smokeless tobacco: Never Used  . Alcohol use Yes     Comment: 3 drinks/week  . Drug use: No  . Sexual activity: Not on file   Other Topics Concern  . Not on file   Social History Narrative   Lives at home with his wife.   Right-handed.   1 cup coffee per day.     PHYSICAL EXAM   There were no vitals filed for this visit.  Not recorded      There is no height or weight on file to calculate BMI.  PHYSICAL EXAMNIATION:  Gen: NAD, conversant, well nourised, obese, well groomed                     Cardiovascular: Regular rate rhythm, no peripheral edema, warm, nontender. Eyes: Conjunctivae clear without exudates or hemorrhage Neck: Supple, no carotid bruits. Pulmonary: Clear to auscultation bilaterally   NEUROLOGICAL EXAM:  MENTAL STATUS: Speech:    Speech is normal; fluent and spontaneous with normal comprehension.    Cognition:     Orientation to time, place and person     Normal recent and remote memory     Normal Attention span and concentration     Normal Language, naming, repeating,spontaneous speech     Fund of knowledge   CRANIAL NERVES: CN II: Visual fields are full to confrontation. Fundoscopic exam is normal with sharp discs and no vascular changes. Pupils are round equal and briskly reactive to light. CN III, IV, VI: extraocular movement are normal. No ptosis. CN V: Facial sensation is intact to pinprick in all 3 divisions bilaterally. Corneal responses are intact.  CN VII: Face is symmetric with normal eye closure and smile. CN VIII: Hearing is normal to rubbing fingers CN IX, X: Palate elevates symmetrically. Phonation is normal. CN XI: Head turning and shoulder shrug are intact CN XII: Tongue is midline with normal movements and no atrophy.  MOTOR: There is no pronator drift of out-stretched arms. Muscle  bulk and tone are normal. Muscle strength is normal.  REFLEXES: Reflexes are 2+ and symmetric at the biceps, triceps, knees, and ankles. Plantar responses are flexor.  SENSORY: Mildly length dependent decreased to light touch, pinprick to mid foot, and decreased  vibratory sensation at toes  COORDINATION: Rapid alternating movements and fine finger movements are intact. There is no dysmetria on finger-to-nose and heel-knee-shin.    GAIT/STANCE: Posture is normal. Gait is steady with normal steps, base, arm swing, and turning. Heel and toe walking are normal. Tandem gait is normal.  Romberg is absent.   DIAGNOSTIC DATA (LABS, IMAGING, TESTING) - I reviewed patient records, labs, notes, testing and imaging myself where available.   ASSESSMENT AND PLAN  KEION NEELS is a 66 y.o. 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 Ph.D.  [facility] [address] [place], [place] [address] Ph: [phone] Fax: [phone]  CC: Willey Blade, MD

## 2017-05-27 IMAGING — US US SCROTUM
1 series · 14 of 25 positions shown · non-contrast
Comparison: None.

CLINICAL DATA: Chronic right testicular retraction for 5-6 years.

EXAM:
ULTRASOUND OF SCROTUM
TECHNIQUE: Complete ultrasound examination of the testicles, epididymis, and
other scrotal structures was performed.

[Series 1: us scrotum · 14 of 31 slices shown]
[im 1/31]
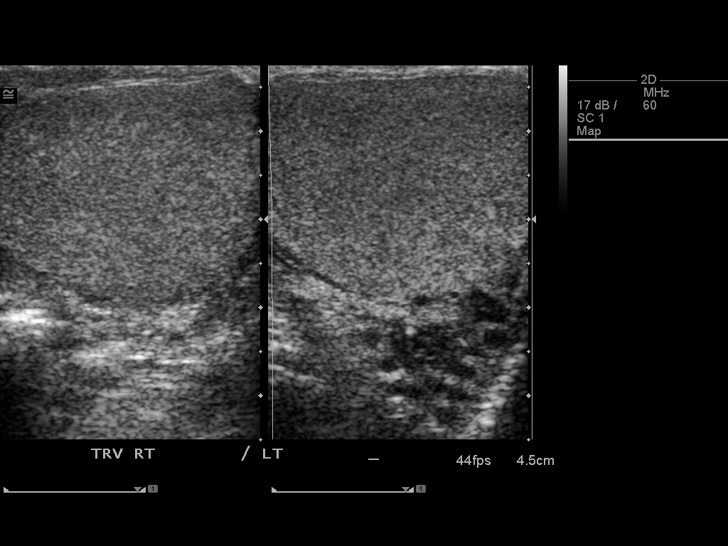
[im 3/31]
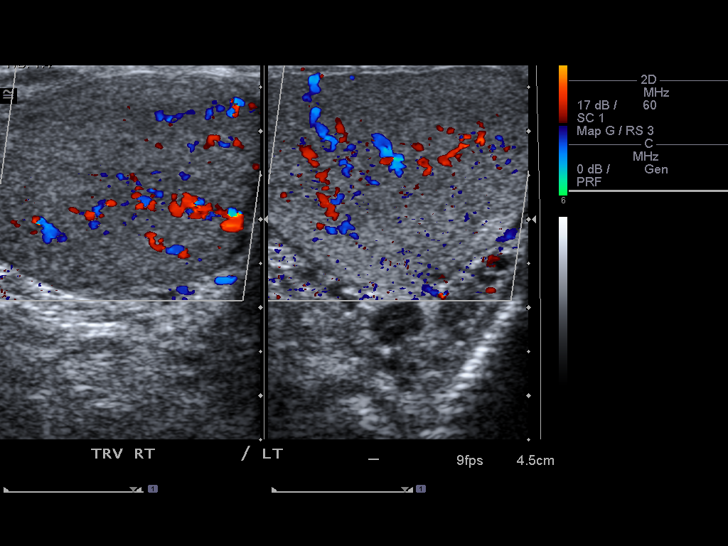
[im 6/31]
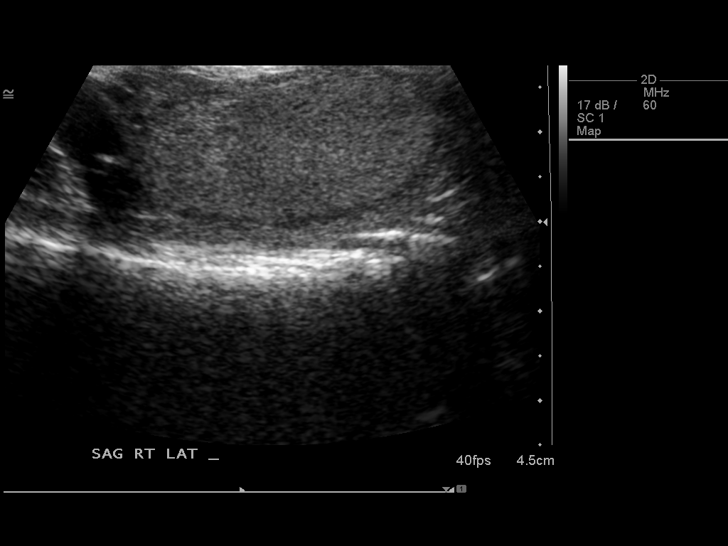
[im 8/31]
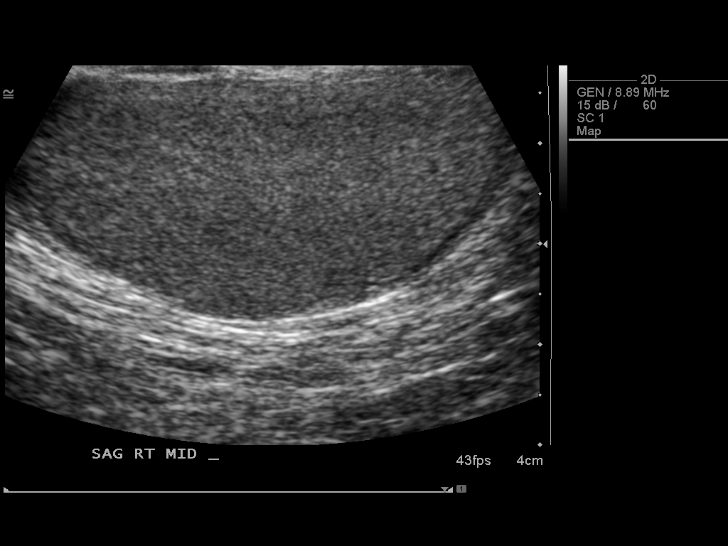
[im 11/31]
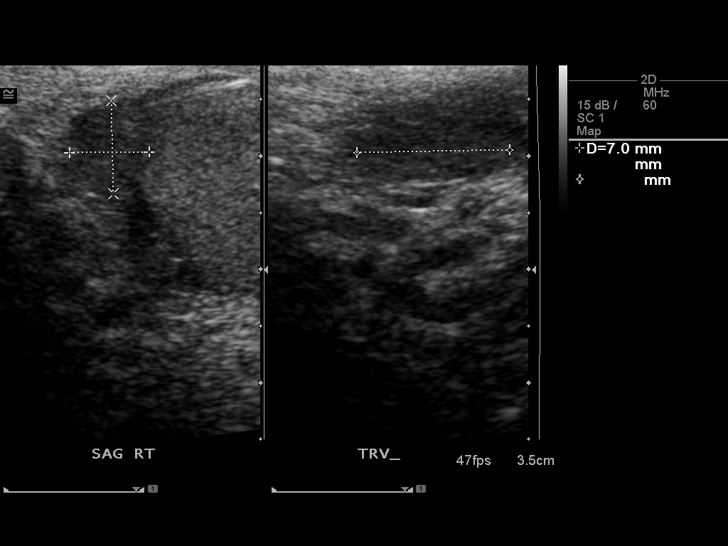
[im 12/31]
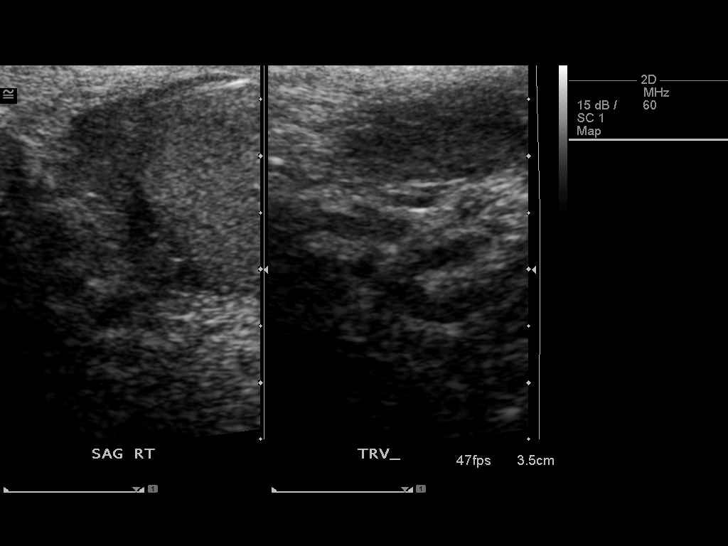
[im 14/31]
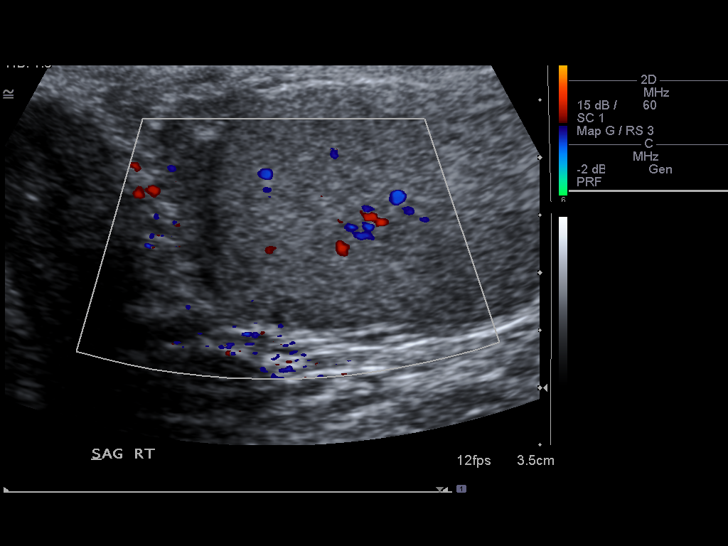
[im 17/31]
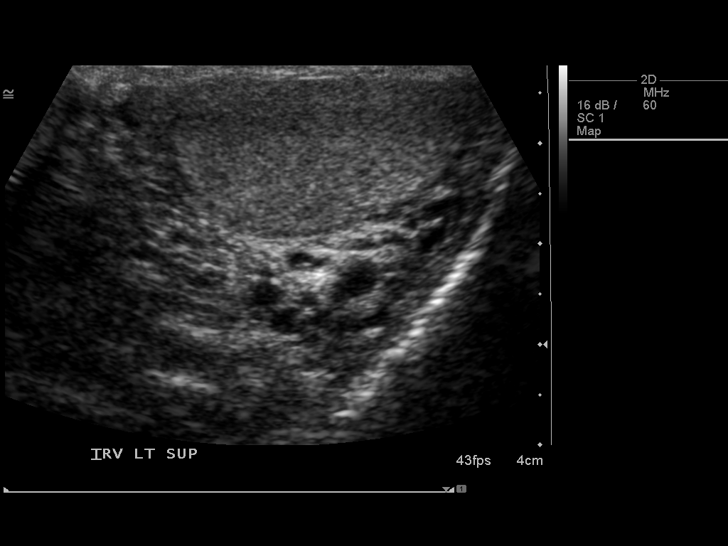
[im 19/31]
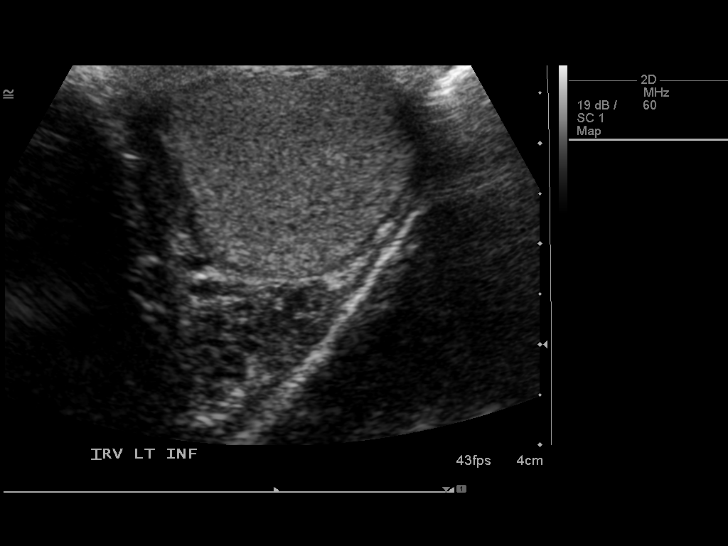
[im 21/31]
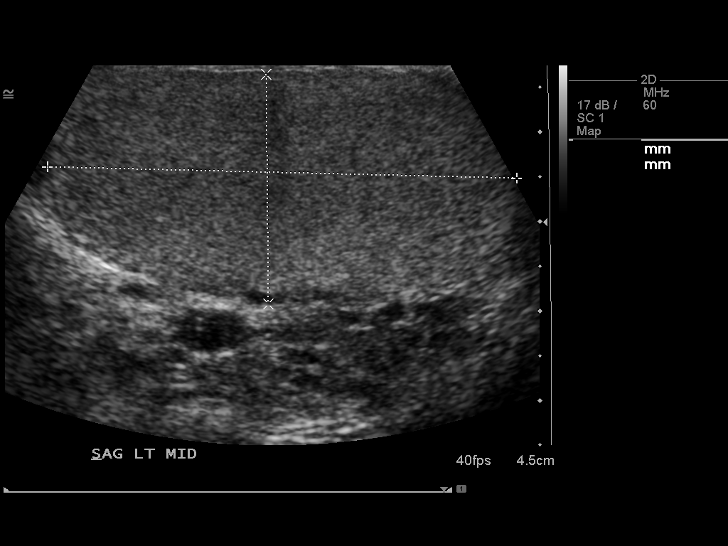
[im 23/31]
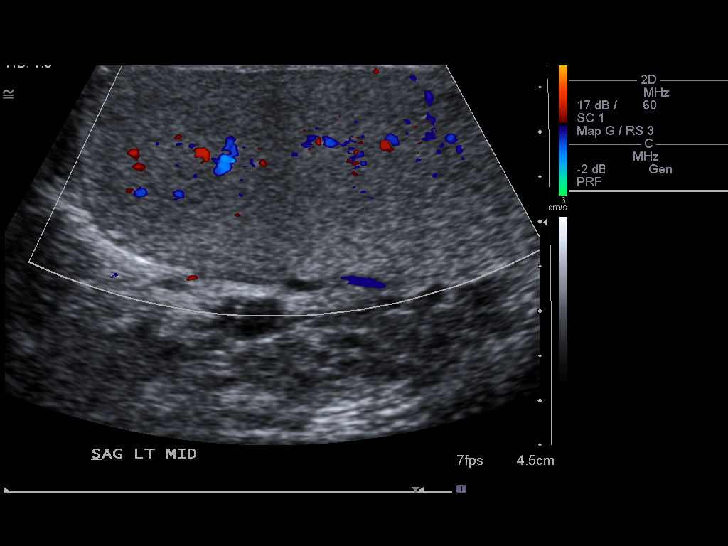
[im 26/31]
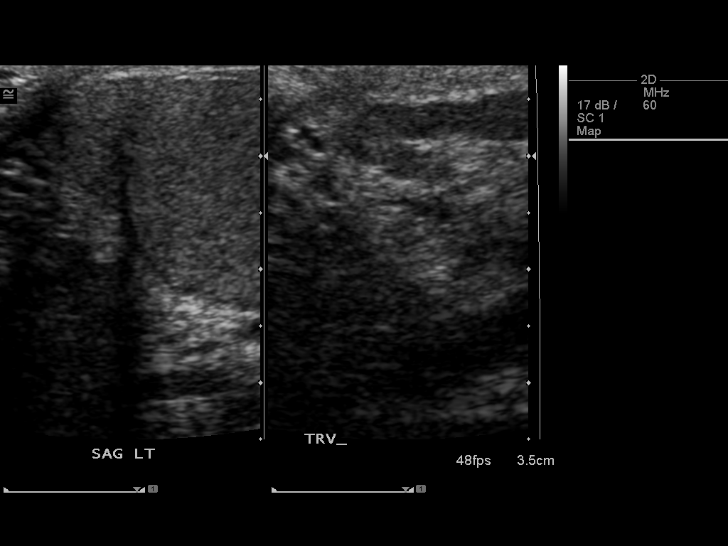
[im 28/31]
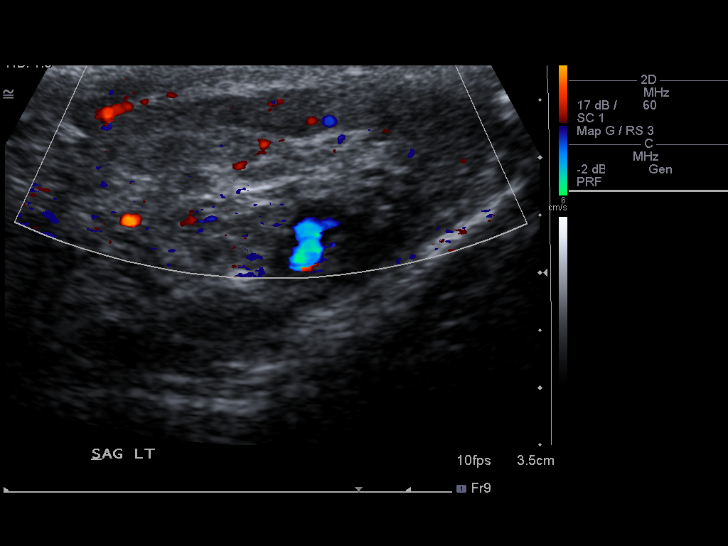
[im 31/31]
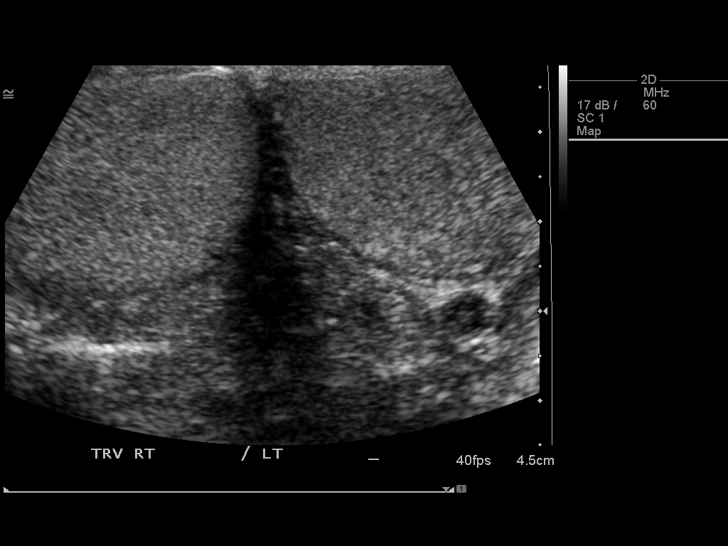

[14 of 25 positions shown; findings below may reference images not displayed]

FINDINGS: Right testicle

Measurements: 4.8 x 2.4 x 3.7 cm. No mass or microlithiasis
visualized.

Left testicle

Measurements: 5.3 x 2.6 x 3.8 cm. No mass or microlithiasis
visualized.

Right epididymis:  Normal in size and appearance.

Left epididymis:  Normal in size and appearance.

Hydrocele:  None visualized.

Varicocele:  None visualized.
IMPRESSION: Normal scrotal sonogram. Testes appear normal in position within the
scrotum. No testicular mass.

## 2017-09-03 ENCOUNTER — Telehealth: Payer: Self-pay | Admitting: *Deleted

## 2017-09-03 ENCOUNTER — Encounter: Payer: Self-pay | Admitting: Neurology

## 2017-09-03 MED ORDER — GABAPENTIN 100 MG PO CAPS: ORAL_CAPSULE | ORAL | 11 refills | Status: DC

## 2017-09-03 NOTE — Addendum Note (Signed)
Addended by: Lindell SparKIRKMAN, MICHELLE C on: 09/03/2017 01:45 PM   Modules accepted: Orders

## 2017-09-03 NOTE — Telephone Encounter (Signed)
Per vo by Dr. Terrace ArabiaYan, okay to provide new rx for gabapentin 100mg , five capsules at bedtime.  Prescription sent to patient's requested pharmacy.

## 2017-09-03 NOTE — Telephone Encounter (Addendum)
He was prescribed gabapentin 100mg , TID at his last office visit with instructions to increase dose, if needed.  He has been taking gabapentin 100mg , five tablets at bedtime and states this is working well to control his symptoms. It has also allowed him to sleep better.  He is requesting a new prescription reflecting this new dose.

## 2017-11-23 ENCOUNTER — Telehealth: Payer: Self-pay | Admitting: Neurology

## 2017-11-23 ENCOUNTER — Ambulatory Visit: Payer: PRIVATE HEALTH INSURANCE | Admitting: Neurology

## 2017-11-23 ENCOUNTER — Encounter: Payer: Self-pay | Admitting: Neurology

## 2017-11-23 VITALS — BP 149/94 | HR 94 | Ht 76.0 in | Wt 215.5 lb

## 2017-11-23 DIAGNOSIS — G43709 Chronic migraine without aura, not intractable, without status migrainosus: Secondary | ICD-10-CM | POA: Diagnosis not present

## 2017-11-23 DIAGNOSIS — G609 Hereditary and idiopathic neuropathy, unspecified: Secondary | ICD-10-CM

## 2017-11-23 DIAGNOSIS — IMO0002 Reserved for concepts with insufficient information to code with codable children: Secondary | ICD-10-CM

## 2017-11-23 MED ORDER — GABAPENTIN 400 MG PO CAPS: 800.0000 mg | ORAL_CAPSULE | Freq: Every day | ORAL | Status: DC

## 2017-11-23 MED ORDER — GABAPENTIN 400 MG PO CAPS: 800.0000 mg | ORAL_CAPSULE | Freq: Every day | ORAL | 4 refills | Status: DC

## 2017-11-23 NOTE — Progress Notes (Signed)
PATIENT: Gavin Fowler DOB: 1951-01-26  HISTORICAL  Gavin Fowler 67 years old right-handed male, seen in refer by his primary care doctor  Willey Blade for evaluation of burning and tingling of bilateral feet, initial evaluation was on May 05 2017.  I reviewed and summarized the referring note, he had a history of prostate cancer in October 2011, had surgery with Dr. Alinda Money, with side effect of erectile dysfunction, bladder incontinence, he has tried Trimex (combination of papaverin, phentolamine, prostaglandin E1 mix),  but experienced a painful priapism, was treated at emergency room in 2012.  He reported strong reaction to the medication during treatment, with significantly elevated blood pressure more than 200, with explosive severe headache,  I personally reviewed MRI brain in 2012 that was normal,  Since the event, he has constant headache,variable degree,he has been taking Sudafed frequently since 1980s following his previous sinus surgery, he described it as a sinus headache, bilateral retro-orbital,facial pressure pain,  Now he has frequent low-grade left temporal region pressure pain, he has been taking Sudafed 120 mg up to 3 times a week to control the symptoms, but few times each week, he has more typical migraine headache, proceeded by flashlight, zigzag line in his visual field, lasting for 10 minutes, followed by moderate headache with light noise sensitivity,  He also complains that since his prostate surgery in 2011, he noticed constant right groin area deep pressure pain,  he also noticed gradual onset bilateral lower extremity paresthesia, initially it was at toes, since 2018, it spread to bilateral plantar surface, he denies gait difficulty  MRI of lumbar in April 2012, multilevel degenerative disc disease, there was no significant canal or foraminal narrowing.  Laboratory evaluations, hepatitis C antibody was negative, C-reactive protein 1.1 2, vitamin D  level 27.8, normal hemoglobin 16.2, creatinine 0.94, normal CMP, LDL was elevated 132,  UPDATE May 20 2017: Reviewed laboratory evaluation in August 2018, A1c was 5.3, normal CBC, with hemoglobin of 15.9, CMP, with normal creatinine 0.97, protein electrophoresis, negative RPR, normal ESR, C-reactive protein, TSH, CPK, vitamin B12, vitamin D was mildly decreased at 28.  He continue complains of typical migraine at least twice a month, preceded by visual aura, followed by left-sided headache, he also has a long history of sinus issues, is going to see his ENT at the [facility] soon.  EMG nerve conduction study today is normal, there is no evidence of large size fiber peripheral neuropathy.  UPDATE March 4th 2019: After he stopped taking Sudafed, his headache is under much better control, he reported one headache every 2 months, usually helped by sleep, gabapentin has helped his feet paresthesia, he is taking 100 mg up to 5 tablets every night,  REVIEW OF SYSTEMS: Full 14 system review of systems performed and notable only for as above   ALLERGIES: No Known Allergies  HOME MEDICATIONS: Current Outpatient Medications  Medication Sig Dispense Refill  . Cholecalciferol (VITAMIN D3) 1000 units CAPS Take 1,000 Units by mouth daily.    . Feverfew 380 MG CAPS Take 2 capsules by mouth daily.    Marland Kitchen gabapentin (NEURONTIN) 100 MG capsule Take five capsules at bedtime. 150 capsule 11  . pantoprazole (PROTONIX) 40 MG tablet Take 40 mg by mouth every morning.     . Pseudoephedrine HCl (SUDAFED PO) Take 120 mg by mouth as needed.     . rizatriptan (MAXALT-MLT) 5 MG disintegrating tablet Take 1 tablet (5 mg total) by mouth as needed. May repeat in  2 hours if needed 15 tablet 11  . traMADol (ULTRAM) 50 MG tablet Take 50 mg by mouth as needed.      No current facility-administered medications for this visit.     PAST MEDICAL HISTORY: Past Medical History:  Diagnosis Date  . Chronic sinusitis     . DDD (degenerative disc disease), lumbar   . ED (erectile dysfunction)   . GERD (gastroesophageal reflux disease)   . History of colon polyps    2005--  benign  . History of diverticulitis of colon   . History of prostate cancer    s/p  radical prostatectomy 08-01-2010--  followed by dr borden  . Internal hemorrhoid, bleeding   . Pain in testis    post prostatectomy  . Sigmoid diverticulosis   . SUI (stress urinary incontinence), male     PAST SURGICAL HISTORY: Past Surgical History:  Procedure Laterality Date  . CATARACT EXTRACTION W/ INTRAOCULAR LENS IMPLANT Right 2012  . COLONOSCOPY  last one 2014  . ETHMOIDECTOMY  1989  . HEMORROIDECTOMY    . INGUINAL HERNIA REPAIR Bilateral left  02-25-2007//   right -- as infant  . RETINAL DETACHMENT SURGERY Right 2012  . ROBOT ASSISTED LAPAROSCOPIC RADICAL PROSTATECTOMY  08-01-2010   and RIGHT INGUINAL HERNIA REPAIR  . TRANSANAL HEMORRHOIDAL DEARTERIALIZATION N/A 12/22/2014   Procedure: TRANSANAL HEMORRHOIDAL DEARTERIALIZATION;  Surgeon: Leighton Ruff, MD;  Location: [facility];  Service: General;  Laterality: N/A;    FAMILY HISTORY: Family History  Problem Relation Age of Onset  . Colon cancer Father 64  . Breast cancer Sister     SOCIAL HISTORY:  Social History   Socioeconomic History  . Marital status: Married    Spouse name: Not on file  . Number of children: 8  . Years of education: College  . Highest education level: Not on file  Social Needs  . Financial resource strain: Not on file  . Food insecurity - worry: Not on file  . Food insecurity - inability: Not on file  . Transportation needs - medical: Not on file  . Transportation needs - non-medical: Not on file  Occupational History  . Occupation: attorney  Tobacco Use  . Smoking status: Former Smoker    Years: 25.00    Types: Pipe, Cigars    Last attempt to quit: 09/22/1990    Years since quitting: 27.1  . Smokeless tobacco: Never Used   Substance and Sexual Activity  . Alcohol use: Yes    Comment: 3 drinks/week  . Drug use: No  . Sexual activity: Not on file  Other Topics Concern  . Not on file  Social History Narrative   Lives at home with his wife.   Right-handed.   1 cup coffee per day.     PHYSICAL EXAM   Vitals:   11/23/17 0900  BP: (!) 149/94  Pulse: 94  Weight: 215 lb 8 oz (97.8 kg)  Height: '6\' 4"'  (1.93 m)    Not recorded      Body mass index is 26.23 kg/m.  PHYSICAL EXAMNIATION:  Gen: NAD, conversant, well nourised, obese, well groomed                     Cardiovascular: Regular rate rhythm, no peripheral edema, warm, nontender. Eyes: Conjunctivae clear without exudates or hemorrhage Neck: Supple, no carotid bruits. Pulmonary: Clear to auscultation bilaterally   NEUROLOGICAL EXAM:  MENTAL STATUS: Speech:    Speech is normal; fluent and spontaneous  with normal comprehension.  Cognition:     Orientation to time, place and person     Normal recent and remote memory     Normal Attention span and concentration     Normal Language, naming, repeating,spontaneous speech     Fund of knowledge   CRANIAL NERVES: CN II: Visual fields are full to confrontation. Fundoscopic exam is normal with sharp discs and no vascular changes. Pupils are round equal and briskly reactive to light. CN III, IV, VI: extraocular movement are normal. No ptosis. CN V: Facial sensation is intact to pinprick in all 3 divisions bilaterally. Corneal responses are intact.  CN VII: Face is symmetric with normal eye closure and smile. CN VIII: Hearing is normal to rubbing fingers CN IX, X: Palate elevates symmetrically. Phonation is normal. CN XI: Head turning and shoulder shrug are intact CN XII: Tongue is midline with normal movements and no atrophy.  MOTOR: There is no pronator drift of out-stretched arms. Muscle bulk and tone are normal. Muscle strength is normal.  REFLEXES: Reflexes are 2+ and symmetric at the  biceps, triceps, knees, and ankles. Plantar responses are flexor.  SENSORY: Mildly length dependent decreased to light touch, pinprick to mid foot, and decreased  vibratory sensation at toes  COORDINATION: Rapid alternating movements and fine finger movements are intact. There is no dysmetria on finger-to-nose and heel-knee-shin.    GAIT/STANCE: Posture is normal. Gait is steady with normal steps, base, arm swing, and turning. Heel and toe walking are normal. Tandem gait is normal.  Romberg is absent.   DIAGNOSTIC DATA (LABS, IMAGING, TESTING) - I reviewed patient records, labs, notes, testing and imaging myself where available.   ASSESSMENT AND PLAN  Gavin Fowler is a 67 y.o. male    Chronic migraine with visual aura  Likely a component of Sudafed rebound headache, migraine headache has much improved after he stopped taking Sudafed,  He do not have to try Maxalt 5 mg  NSAIDs as needed  Bilateral lower extremity paresthesia  Possible small fiber peripheral neuropathy, there is no evidence of large fiber neuropathy   Laboratory evaluation showed no treatable  etiology  Gabapentin 400 mg 2 tablets every night , Marcial Pacas, M.D. Ph.D.  [facility] [address] [place], [place] [address] Ph: [phone] Fax: [phone]  CC: Willey Blade, MD

## 2017-11-23 NOTE — Telephone Encounter (Signed)
[facility] [phone] called needs clarification on directions for RX gabapentin (NEURONTIN) 400 MG capsule sent today. Please call to advise

## 2017-11-23 NOTE — Telephone Encounter (Signed)
Rx was sent with sig stating "take 2 at bedtime. Take 5 capsules daily". I spoke with pharmacy and made them aware that it should only say take 2 capsules at bedtime.

## 2018-11-30 ENCOUNTER — Other Ambulatory Visit: Payer: Self-pay | Admitting: Neurology

## 2019-08-16 ENCOUNTER — Other Ambulatory Visit: Payer: Self-pay | Admitting: Internal Medicine

## 2019-08-16 DIAGNOSIS — R202 Paresthesia of skin: Secondary | ICD-10-CM

## 2019-08-26 ENCOUNTER — Other Ambulatory Visit: Payer: BLUE CROSS/BLUE SHIELD

## 2020-05-07 ENCOUNTER — Other Ambulatory Visit: Payer: Self-pay | Admitting: Unknown Physician Specialty

## 2020-05-07 ENCOUNTER — Telehealth: Payer: Self-pay | Admitting: Unknown Physician Specialty

## 2020-05-07 DIAGNOSIS — U071 COVID-19: Secondary | ICD-10-CM

## 2020-05-07 DIAGNOSIS — I10 Essential (primary) hypertension: Secondary | ICD-10-CM

## 2020-05-07 NOTE — Telephone Encounter (Signed)
I connected by phone with Gavin Fowler on 05/07/2020 at 10:15 AM to discuss the potential use of a new treatment for mild to moderate COVID-19 viral infection in non-hospitalized patients.  This patient is a 69 y.o. male that meets the FDA criteria for Emergency Use Authorization of COVID monoclonal antibody casirivimab/imdevimab.  Has a (+) direct SARS-CoV-2 viral test result  Has mild or moderate COVID-19   Is NOT hospitalized due to COVID-19  Is within 10 days of symptom onset  Has at least one of the high risk factor(s) for progression to severe COVID-19 and/or hospitalization as defined in EUA.  Specific high risk criteria : Older age (>/= 67 yo)   I have spoken and communicated the following to the patient or parent/caregiver regarding COVID monoclonal antibody treatment:  1. FDA has authorized the emergency use for the treatment of mild to moderate COVID-19 in adults and pediatric patients with positive results of direct SARS-CoV-2 viral testing who are 23 years of age and older weighing at least 40 kg, and who are at high risk for progressing to severe COVID-19 and/or hospitalization.  2. The significant known and potential risks and benefits of COVID monoclonal antibody, and the extent to which such potential risks and benefits are unknown.  3. Information on available alternative treatments and the risks and benefits of those alternatives, including clinical trials.  4. Patients treated with COVID monoclonal antibody should continue to self-isolate and use infection control measures (e.g., wear mask, isolate, social distance, avoid sharing personal items, clean and disinfect "high touch" surfaces, and frequent handwashing) according to CDC guidelines.   5. The patient or parent/caregiver has the option to accept or refuse COVID monoclonal antibody treatment.  After reviewing this information with the patient, The patient agreed to proceed with receiving casirivimab\imdevimab  infusion and will be provided a copy of the Fact sheet prior to receiving the infusion. [name] 05/07/2020 10:15 AM  Sx onset 8/10

## 2020-05-08 ENCOUNTER — Ambulatory Visit (HOSPITAL_COMMUNITY)
Admission: RE | Admit: 2020-05-08 | Discharge: 2020-05-08 | Disposition: A | Discharge status: Home / Self Care | Payer: Medicare Other | Source: Ambulatory Visit | Attending: Pulmonary Disease | Admitting: Pulmonary Disease

## 2020-05-08 DIAGNOSIS — Z23 Encounter for immunization: Secondary | ICD-10-CM | POA: Diagnosis not present

## 2020-05-08 DIAGNOSIS — I10 Essential (primary) hypertension: Secondary | ICD-10-CM | POA: Diagnosis not present

## 2020-05-08 DIAGNOSIS — U071 COVID-19: Secondary | ICD-10-CM | POA: Diagnosis present

## 2020-05-08 MED ORDER — SODIUM CHLORIDE 0.9 % IV SOLN: INTRAVENOUS | Status: DC | PRN

## 2020-05-08 MED ORDER — FAMOTIDINE IN NACL 20-0.9 MG/50ML-% IV SOLN: 20.0000 mg | Freq: Once | INTRAVENOUS | Status: DC | PRN

## 2020-05-08 MED ORDER — EPINEPHRINE 0.3 MG/0.3ML IJ SOAJ: 0.3000 mg | Freq: Once | INTRAMUSCULAR | Status: DC | PRN

## 2020-05-08 MED ORDER — ALBUTEROL SULFATE HFA 108 (90 BASE) MCG/ACT IN AERS: 2.0000 | INHALATION_SPRAY | Freq: Once | RESPIRATORY_TRACT | Status: DC | PRN

## 2020-05-08 MED ORDER — DIPHENHYDRAMINE HCL 50 MG/ML IJ SOLN: 50.0000 mg | Freq: Once | INTRAMUSCULAR | Status: DC | PRN

## 2020-05-08 MED ORDER — SODIUM CHLORIDE 0.9 % IV SOLN
1200.0000 mg | Freq: Once | INTRAVENOUS | Status: AC
  Administered 2020-05-08: 1200 mg via INTRAVENOUS
  Filled 2020-05-08: qty 10

## 2020-05-08 MED ORDER — METHYLPREDNISOLONE SODIUM SUCC 125 MG IJ SOLR: 125.0000 mg | Freq: Once | INTRAMUSCULAR | Status: DC | PRN

## 2020-05-08 NOTE — Progress Notes (Signed)
Patient arrived to [facility] infusion clinic for REGEN-COV. Infusion completed. VSS. AVS reviewed. Discharged to home.

## 2020-05-08 NOTE — Discharge Instructions (Signed)
10 Things You Can Do to Manage Your COVID-19 Symptoms at Home If you have possible or confirmed COVID-19: 1. Stay home from work and school. And stay away from other public places. If you must go out, avoid using any kind of public transportation, ridesharing, or taxis. 2. Monitor your symptoms carefully. If your symptoms get worse, call your healthcare provider immediately. 3. Get rest and stay hydrated. 4. If you have a medical appointment, call the healthcare provider ahead of time and tell them that you have or may have COVID-19. 5. For medical emergencies, call 911 and notify the dispatch personnel that you have or may have COVID-19. 6. Cover your cough and sneezes with a tissue or use the inside of your elbow. 7. Wash your hands often with soap and water for at least 20 seconds or clean your hands with an alcohol-based hand sanitizer that contains at least 60% alcohol. 8. As much as possible, stay in a specific room and away from other people in your home. Also, you should use a separate bathroom, if available. If you need to be around other people in or outside of the home, wear a mask. 9. Avoid sharing personal items with other people in your household, like dishes, towels, and bedding. 10. Clean all surfaces that are touched often, like counters, tabletops, and doorknobs. Use household cleaning sprays or wipes according to the label instructions. [employer]Flowers.co.uk 03/23/2019 This information is not intended to replace advice given to you by your health care provider. Make sure you discuss any questions you have with your health care provider. Document Revised: 08/25/2019 Document Reviewed: 08/25/2019 Elsevier Patient Education  2020 [employer].     COVID-19 Frequently Asked Questions COVID-19 (coronavirus disease) is an infection that is caused by a large family of viruses. Some viruses cause illness in people and others cause illness in animals like camels, cats, and bats. In  some cases, the viruses that cause illness in animals can spread to humans. Where did the coronavirus come from? In December 2019, [place] told the [employer] [facility]) of several cases of lung disease (human respiratory illness). These cases were linked to an open seafood and livestock market in the city of [place]. The link to the seafood and livestock market suggests that the virus may have spread from animals to humans. However, since that first outbreak in December, the virus has also been shown to spread from person to person. What is the name of the disease and the virus? Disease name Early on, this disease was called novel coronavirus. This is because scientists determined that the disease was caused by a new (novel) respiratory virus. The World Health Organization [facility]) has now named the disease COVID-19, or coronavirus disease. Virus name The virus that causes the disease is called severe acute respiratory syndrome coronavirus 2 (SARS-CoV-2). More information on disease and virus naming World Health Organization [facility]): www.who.int/emergencies/diseases/novel-coronavirus-2019/technical-guidance/naming-the-coronavirus-disease-(covid-2019)-and-the-virus-that-causes-it Who is at risk for complications from coronavirus disease? Some people may be at higher risk for complications from coronavirus disease. This includes older adults and people who have chronic diseases, such as heart disease, diabetes, and lung disease. If you are at higher risk for complications, take these extra precautions:  Stay home as much as possible.  Avoid social gatherings and travel.  Avoid close contact with others. Stay at least 6 ft (2 m) away from others, if possible.  Wash your hands often with soap and water for at least 20 seconds.  Avoid touching your face, mouth, nose,  or eyes.  Keep supplies on hand at home, such as food, medicine, and cleaning supplies.  If you must go out in public, wear a  cloth face covering or face mask. Make sure your mask covers your nose and mouth. How does coronavirus disease spread? The virus that causes coronavirus disease spreads easily from person to person (is contagious). You may catch the virus by:  Breathing in droplets from an infected person. Droplets can be spread by a person breathing, speaking, singing, coughing, or sneezing.  Touching something, like a table or a doorknob, that was exposed to the virus (contaminated) and then touching your mouth, nose, or eyes. Can I get the virus from touching surfaces or objects? There is still a lot that we do not know about the virus that causes coronavirus disease. Scientists are basing a lot of information on what they know about similar viruses, such as:  Viruses cannot generally survive on surfaces for long. They need a human body (host) to survive.  It is more likely that the virus is spread by close contact with people who are sick (direct contact), such as through: ? Shaking hands or hugging. ? Breathing in respiratory droplets that travel through the air. Droplets can be spread by a person breathing, speaking, singing, coughing, or sneezing.  It is less likely that the virus is spread when a person touches a surface or object that has the virus on it (indirect contact). The virus may be able to enter the body if the person touches a surface or object and then touches his or her face, eyes, nose, or mouth. Can a person spread the virus without having symptoms of the disease? It may be possible for the virus to spread before a person has symptoms of the disease, but this is most likely not the main way the virus is spreading. It is more likely for the virus to spread by being in close contact with people who are sick and breathing in the respiratory droplets spread by a person breathing, speaking, singing, coughing, or sneezing. What are the symptoms of coronavirus disease? Symptoms vary from person to  person and can range from mild to severe. Symptoms may include:  Fever or chills.  Cough.  Difficulty breathing or feeling short of breath.  Headaches, body aches, or muscle aches.  Runny or stuffy (congested) nose.  Sore throat.  New loss of taste or smell.  Nausea, vomiting, or diarrhea. These symptoms can appear anywhere from 2 to 14 days after you have been exposed to the virus. Some people may not have any symptoms. If you develop symptoms, call your health care provider. People with severe symptoms may need hospital care. Should I be tested for this virus? Your health care provider will decide whether to test you based on your symptoms, history of exposure, and your risk factors. How does a health care provider test for this virus? Health care providers will collect samples to send for testing. Samples may include:  Taking a swab of fluid from the back of your nose and throat, your nose, or your throat.  Taking fluid from the lungs by having you cough up mucus (sputum) into a sterile cup.  Taking a blood sample. Is there a treatment or vaccine for this virus? Currently, there is no vaccine to prevent coronavirus disease. Also, there are no medicines like antibiotics or antivirals to treat the virus. A person who becomes sick is given supportive care, which means rest and fluids. A  person may also relieve his or her symptoms by using over-the-counter medicines that treat sneezing, coughing, and runny nose. These are the same medicines that a person takes for the common cold. If you develop symptoms, call your health care provider. People with severe symptoms may need hospital care. What can I do to protect myself and my family from this virus?     You can protect yourself and your family by taking the same actions that you would take to prevent the spread of other viruses. Take the following actions:  Wash your hands often with soap and water for at least 20 seconds. If soap  and water are not available, use alcohol-based hand sanitizer.  Avoid touching your face, mouth, nose, or eyes.  Cough or sneeze into a tissue, sleeve, or elbow. Do not cough or sneeze into your hand or the air. ? If you cough or sneeze into a tissue, throw it away immediately and wash your hands.  Disinfect objects and surfaces that you frequently touch every day.  Stay away from people who are sick.  Avoid going out in public, follow guidance from your state and local health authorities.  Avoid crowded indoor spaces. Stay at least 6 ft (2 m) away from others.  If you must go out in public, wear a cloth face covering or face mask. Make sure your mask covers your nose and mouth.  Stay home if you are sick, except to get medical care. Call your health care provider before you get medical care. Your health care provider will tell you how long to stay home.  Make sure your vaccines are up to date. Ask your health care provider what vaccines you need. What should I do if I need to travel? Follow travel recommendations from your local health authority, the CDC, and WHO. Travel information and advice  Centers for Disease Control and Prevention (CDC): GeminiCard.glwww.cdc.gov/coronavirus/2019-ncov/travelers/index.html  World Health Organization [facility](WHO): PreviewDomains.sewww.who.int/emergencies/diseases/novel-coronavirus-2019/travel-advice Know the risks and take action to protect your health  You are at higher risk of getting coronavirus disease if you are traveling to areas with an outbreak or if you are exposed to travelers from areas with an outbreak.  Wash your hands often and practice good hygiene to lower the risk of catching or spreading the virus. What should I do if I am sick? General instructions to stop the spread of infection  Wash your hands often with soap and water for at least 20 seconds. If soap and water are not available, use alcohol-based hand sanitizer.  Cough or sneeze into a tissue, sleeve, or  elbow. Do not cough or sneeze into your hand or the air.  If you cough or sneeze into a tissue, throw it away immediately and wash your hands.  Stay home unless you must get medical care. Call your health care provider or local health authority before you get medical care.  Avoid public areas. Do not take public transportation, if possible.  If you can, wear a mask if you must go out of the house or if you are in close contact with someone who is not sick. Make sure your mask covers your nose and mouth. Keep your home clean  Disinfect objects and surfaces that are frequently touched every day. This may include: ? Counters and tables. ? Doorknobs and light switches. ? Sinks and faucets. ? Electronics such as phones, remote controls, keyboards, computers, and tablets.  Wash dishes in hot, soapy water or use a dishwasher. Air-dry your dishes.  Wash laundry in hot water. Prevent infecting other household members  Let healthy household members care for children and pets, if possible. If you have to care for children or pets, wash your hands often and wear a mask.  Sleep in a different bedroom or bed, if possible.  Do not share personal items, such as razors, toothbrushes, deodorant, combs, brushes, towels, and washcloths. Where to find more information Centers for Disease Control and Prevention (CDC)  Information and news updates: CardRetirement.cz World Health Organization [facility])  Information and news updates: AffordableSalon.es  Coronavirus health topic: https://thompson-craig.com/  Questions and answers on COVID-19: kruiseway.com  Global tracker: who.sprinklr.com American Academy of Pediatrics (AAP)  Information for families: www.healthychildren.org/English/health-issues/conditions/chest-lungs/Pages/2019-Novel-Coronavirus.aspx The coronavirus situation is changing rapidly. Check  your local health authority website or the CDC and [facility] websites for updates and news. When should I contact a health care provider?  Contact your health care provider if you have symptoms of an infection, such as fever or cough, and you: ? Have been near anyone who is known to have coronavirus disease. ? Have come into contact with a person who is suspected to have coronavirus disease. ? Have traveled to an area where there is an outbreak of COVID-19. When should I get emergency medical care?  Get help right away by calling your local emergency services (911 in the [place] if you have: ? Trouble breathing. ? Pain or pressure in your chest. ? Confusion. ? Blue-tinged lips and fingernails. ? Difficulty waking from sleep. ? Symptoms that get worse. Let the emergency medical personnel know if you think you have coronavirus disease. Summary  A new respiratory virus is spreading from person to person and causing COVID-19 (coronavirus disease).  The virus that causes COVID-19 appears to spread easily. It spreads from one person to another through droplets from breathing, speaking, singing, coughing, or sneezing.  Older adults and those with chronic diseases are at higher risk of disease. If you are at higher risk for complications, take extra precautions.  There is currently no vaccine to prevent coronavirus disease. There are no medicines, such as antibiotics or antivirals, to treat the virus.  You can protect yourself and your family by washing your hands often, avoiding touching your face, and covering your coughs and sneezes. This information is not intended to replace advice given to you by your health care provider. Make sure you discuss any questions you have with your health care provider. Document Revised: 07/08/2019 Document Reviewed: 01/04/2019 Elsevier Patient Education  2020 Elsevier  What types of side effects do monoclonal antibody drugs cause?  Common side effects  In  general, the more common side effects caused by monoclonal antibody drugs include: . Allergic reactions, such as hives or itching . Flu-like signs and symptoms, including chills, fatigue, fever, and muscle aches and pains . Nausea, vomiting . Diarrhea . Skin rashes . Low blood pressure   The CDC is recommending patients who receive monoclonal antibody treatments wait at least 90 days before being vaccinated.  Currently, there are no data on the safety and efficacy of mRNA COVID-19 vaccines in persons who received monoclonal antibodies or convalescent plasma as part of COVID-19 treatment. Based on the estimated half-life of such therapies as well as evidence suggesting that reinfection is uncommon in the 90 days after initial infection, vaccination should be deferred for at least 90 days, as a precautionary measure until additional information becomes available, to avoid interference of the antibody treatment with vaccine-induced immune responses.

## 2020-10-23 ENCOUNTER — Other Ambulatory Visit: Payer: Self-pay

## 2020-10-23 ENCOUNTER — Ambulatory Visit (INDEPENDENT_AMBULATORY_CARE_PROVIDER_SITE_OTHER): Payer: Medicare Other | Admitting: Ophthalmology

## 2020-10-23 ENCOUNTER — Encounter (INDEPENDENT_AMBULATORY_CARE_PROVIDER_SITE_OTHER): Payer: Self-pay | Admitting: Ophthalmology

## 2020-10-23 DIAGNOSIS — H35351 Cystoid macular degeneration, right eye: Secondary | ICD-10-CM

## 2020-10-23 DIAGNOSIS — Z9889 Other specified postprocedural states: Secondary | ICD-10-CM

## 2020-10-23 DIAGNOSIS — H43812 Vitreous degeneration, left eye: Secondary | ICD-10-CM | POA: Diagnosis not present

## 2020-10-23 DIAGNOSIS — H35371 Puckering of macula, right eye: Secondary | ICD-10-CM | POA: Diagnosis not present

## 2020-10-23 NOTE — Assessment & Plan Note (Signed)
Will monitor closely and asked review of systems regarding sleep apnea

## 2020-10-23 NOTE — Progress Notes (Signed)
10/23/2020     CHIEF COMPLAINT Patient presents for Retina Follow Up (1 Year F/U OU//Pt sts VA OD has improved since last year. Pt sts "eyes don't seem to coordinate sometimes." Pt denies ocular pain, flashes of light, or floaters OU. //)   HISTORY OF PRESENT ILLNESS: Gavin Fowler is a 70 y.o. male who presents to the clinic today for:   HPI    Retina Follow Up    Patient presents with  Other.  In both eyes.  This started 1 year ago.  Severity is mild.  Duration of 1 year.  Since onset it is stable. Additional comments: 1 Year F/U OU  Pt sts VA OD has improved since last year. Pt sts "eyes don't seem to coordinate sometimes." Pt denies ocular pain, flashes of light, or floaters OU.          Last edited by Rockie Neighbours, [place] on 10/23/2020  8:11 AM. (History)      Referring physician: Willey Blade, MD [address] [place],  [place] [address]  HISTORICAL INFORMATION:   Selected notes from the MEDICAL RECORD NUMBER    Lab Results  Component Value Date   HGBA1C 5.3 05/05/2017     CURRENT MEDICATIONS: No current outpatient medications on file. (Ophthalmic Drugs)   No current facility-administered medications for this visit. (Ophthalmic Drugs)   Current Outpatient Medications (Other)  Medication Sig  . Cholecalciferol (VITAMIN D3) 1000 units CAPS Take 1,000 Units by mouth daily.  . Feverfew 380 MG CAPS Take 2 capsules by mouth daily.  Marland Kitchen gabapentin (NEURONTIN) 400 MG capsule Take 2 capsules (800 mg total) by mouth at bedtime. Please call [phone] to schedule yearly appt or may request future refills from PCP.  Marland Kitchen pantoprazole (PROTONIX) 40 MG tablet Take 40 mg by mouth every morning.   . Pseudoephedrine HCl (SUDAFED PO) Take 120 mg by mouth as needed.   . rizatriptan (MAXALT-MLT) 5 MG disintegrating tablet Take 1 tablet (5 mg total) by mouth as needed. May repeat in 2 hours if needed  . traMADol (ULTRAM) 50 MG tablet Take 50 mg by mouth as needed.    No  current facility-administered medications for this visit. (Other)      REVIEW OF SYSTEMS:    ALLERGIES No Known Allergies  PAST MEDICAL HISTORY Past Medical History:  Diagnosis Date  . Chronic sinusitis   . DDD (degenerative disc disease), lumbar   . ED (erectile dysfunction)   . GERD (gastroesophageal reflux disease)   . History of colon polyps    2005--  benign  . History of diverticulitis of colon   . History of prostate cancer    s/p  radical prostatectomy 08-01-2010--  followed by dr borden  . Internal hemorrhoid, bleeding   . Pain in testis    post prostatectomy  . Sigmoid diverticulosis   . SUI (stress urinary incontinence), male    Past Surgical History:  Procedure Laterality Date  . CATARACT EXTRACTION W/ INTRAOCULAR LENS IMPLANT Right 2012  . COLONOSCOPY  last one 2014  . ETHMOIDECTOMY  1989  . HEMORROIDECTOMY    . INGUINAL HERNIA REPAIR Bilateral left  02-25-2007//   right -- as infant  . RETINAL DETACHMENT SURGERY Right 2012  . ROBOT ASSISTED LAPAROSCOPIC RADICAL PROSTATECTOMY  08-01-2010   and RIGHT INGUINAL HERNIA REPAIR  . TRANSANAL HEMORRHOIDAL DEARTERIALIZATION N/A 12/22/2014   Procedure: TRANSANAL HEMORRHOIDAL DEARTERIALIZATION;  Surgeon: Leighton Ruff, MD;  Location[facility];  Service: General;  Laterality: N/A;    FAMILY HISTORY Family History  Problem Relation Age of Onset  . Colon cancer Father 26  . Breast cancer Sister     SOCIAL HISTORY Social History   Tobacco Use  . Smoking status: Former Smoker    Years: 25.00    Types: Pipe, Cigars    Quit date: 09/22/1990    Years since quitting: 30.1  . Smokeless tobacco: Never Used  Vaping Use  . Vaping Use: Never used  Substance Use Topics  . Alcohol use: Yes    Comment: 3 drinks/week  . Drug use: No         OPHTHALMIC EXAM:  Base Eye Exam    Visual Acuity (ETDRS)      Right Left   Dist [place] 20/20 -1 20/20       Tonometry (Tonopen, 8:09 AM)      Right Left    Pressure 14 14       Pupils      Dark Light Shape React APD   Right 4 3 Round Brisk None   Left 5 4 Round Brisk None       Visual Fields (Counting fingers)      Left Right    Full Full       Extraocular Movement      Right Left    Full Full       Neuro/Psych    Oriented x3: Yes   Mood/Affect: Normal       Dilation    Both eyes:         Slit Lamp and Fundus Exam    External Exam      Right Left   External Normal Normal       Slit Lamp Exam      Right Left   Lids/Lashes Normal Normal   Conjunctiva/Sclera White and quiet White and quiet   Cornea Clear Clear   Anterior Chamber Deep and quiet Deep and quiet   Iris Round and reactive Round and reactive   Lens Posterior chamber intraocular lens Posterior chamber intraocular lens   Anterior Vitreous Normal Normal       Fundus Exam      Right Left   Posterior Vitreous vitrectomized    Disc Normal Normal   C/D Ratio 0.35 0.3   Macula Normal Normal   Vessels Normal Normal   Periphery Normal Normal          IMAGING AND PROCEDURES  Imaging and Procedures for 10/23/20  OCT, Retina - OU - Both Eyes       Right Eye Quality was good. Scan locations included subfoveal. Central Foveal Thickness: 419. Progression has been stable. Findings include abnormal foveal contour.   Left Eye Quality was good. Scan locations included subfoveal. Central Foveal Thickness: 296.   Notes No significant change in macular thickening right eye.  There is a small new area of intraretinal cystoid change which is microscopic and not likely MAC-TEL at this time but it is new and deserves observation right eye                ASSESSMENT/PLAN:  Cystoid macular edema, right eye Will monitor closely and asked review of systems regarding sleep apnea      ICD-10-CM   1. Right epiretinal membrane  H35.371 OCT, Retina - OU - Both Eyes  2. History of vitrectomy  Z98.890   3. Posterior vitreous detachment of left eye   H43.812   4. Cystoid macular  edema, right eye  H35.351     1.  2.  3.  Ophthalmic Meds Ordered this visit:  No orders of the defined types were placed in this encounter.      Return in about 1 year (around 10/23/2021) for DILATE OU, OCT.  There are no Patient Instructions on file for this visit.   Explained the diagnoses, plan, and follow up with the patient and they expressed understanding.  Patient expressed understanding of the importance of proper follow up care.   Clent Demark Dwayne Begay M.D. Diseases & Surgery of the Retina and Vitreous [facility] [place] 10/23/20     Abbreviations: M myopia (nearsighted); A astigmatism; H hyperopia (farsighted); P presbyopia; Mrx spectacle prescription;  CTL contact lenses; OD right eye; OS left eye; OU both eyes  XT exotropia; ET esotropia; PEK punctate epithelial keratitis; PEE punctate epithelial erosions; DES dry eye syndrome; MGD meibomian gland dysfunction; ATs artificial tears; PFAT's preservative free artificial tears; [place] nuclear sclerotic cataract; PSC posterior subcapsular cataract; ERM epi-retinal membrane; PVD posterior vitreous detachment; RD retinal detachment; DM diabetes mellitus; DR diabetic retinopathy; NPDR non-proliferative diabetic retinopathy; PDR proliferative diabetic retinopathy; CSME clinically significant macular edema; DME diabetic macular edema; dbh dot blot hemorrhages; CWS cotton wool spot; POAG primary open angle glaucoma; C/D cup-to-disc ratio; HVF humphrey visual field; GVF goldmann visual field; OCT optical coherence tomography; IOP intraocular pressure; BRVO Branch retinal vein occlusion; CRVO central retinal vein occlusion; CRAO central retinal artery occlusion; BRAO branch retinal artery occlusion; RT retinal tear; SB scleral buckle; PPV pars plana vitrectomy; VH Vitreous hemorrhage; PRP panretinal laser photocoagulation; IVK intravitreal kenalog; VMT vitreomacular traction; MH Macular hole;  NVD  neovascularization of the disc; NVE neovascularization elsewhere; AREDS age related eye disease study; ARMD age related macular degeneration; POAG primary open angle glaucoma; EBMD epithelial/anterior basement membrane dystrophy; ACIOL anterior chamber intraocular lens; IOL intraocular lens; PCIOL posterior chamber intraocular lens; Phaco/IOL phacoemulsification with intraocular lens placement; [place] photorefractive keratectomy; LASIK laser assisted in situ keratomileusis; HTN hypertension; DM diabetes mellitus; COPD chronic obstructive pulmonary disease

## 2021-08-20 ENCOUNTER — Other Ambulatory Visit: Payer: Self-pay | Admitting: Internal Medicine

## 2021-08-20 DIAGNOSIS — Z136 Encounter for screening for cardiovascular disorders: Secondary | ICD-10-CM

## 2021-09-06 ENCOUNTER — Ambulatory Visit
Admission: RE | Admit: 2021-09-06 | Discharge: 2021-09-06 | Disposition: A | Discharge status: Home / Self Care | Payer: Medicare Other | Source: Ambulatory Visit | Attending: Internal Medicine | Admitting: Internal Medicine

## 2021-09-06 DIAGNOSIS — Z136 Encounter for screening for cardiovascular disorders: Secondary | ICD-10-CM

## 2021-10-29 ENCOUNTER — Other Ambulatory Visit: Payer: Self-pay

## 2021-10-29 ENCOUNTER — Encounter (INDEPENDENT_AMBULATORY_CARE_PROVIDER_SITE_OTHER): Payer: Self-pay | Admitting: Ophthalmology

## 2021-10-29 ENCOUNTER — Ambulatory Visit (INDEPENDENT_AMBULATORY_CARE_PROVIDER_SITE_OTHER): Payer: Medicare Other | Admitting: Ophthalmology

## 2021-10-29 DIAGNOSIS — Z9889 Other specified postprocedural states: Secondary | ICD-10-CM

## 2021-10-29 DIAGNOSIS — H35351 Cystoid macular degeneration, right eye: Secondary | ICD-10-CM

## 2021-10-29 DIAGNOSIS — H43812 Vitreous degeneration, left eye: Secondary | ICD-10-CM

## 2021-10-29 DIAGNOSIS — H35371 Puckering of macula, right eye: Secondary | ICD-10-CM | POA: Diagnosis not present

## 2021-10-29 NOTE — Assessment & Plan Note (Signed)
OS, no holes or tears 

## 2021-10-29 NOTE — Progress Notes (Signed)
10/29/2021     CHIEF COMPLAINT Patient presents for  Chief Complaint  Patient presents with   Retina Follow Up      HISTORY OF PRESENT ILLNESS: Gavin Fowler is a 71 y.o. male who presents to the clinic today for:   HPI     Retina Follow Up           Diagnosis: Other   Laterality: both eyes   Onset: 1 year ago   Severity: mild   Duration: 1 year   Course: stable         Comments   1 year fu ou and OCT  Pt states, "I had surgery to my right eye some years back and I can see ok I think but the center of my vision in that eye is not great. It is blurred. " Pt states VA OU stable since last visit. Pt denies FOL, floaters, or ocular pain OU.        Last edited by Kendra Opitz, COA on 10/29/2021  8:09 AM.      Referring physician: Willey Blade, MD [address] [place],  [place] [address]  HISTORICAL INFORMATION:   Selected notes from the MEDICAL RECORD NUMBER    Lab Results  Component Value Date   HGBA1C 5.3 05/05/2017     CURRENT MEDICATIONS: No current outpatient medications on file. (Ophthalmic Drugs)   No current facility-administered medications for this visit. (Ophthalmic Drugs)   Current Outpatient Medications (Other)  Medication Sig   Cholecalciferol (VITAMIN D3) 1000 units CAPS Take 1,000 Units by mouth daily.   Feverfew 380 MG CAPS Take 2 capsules by mouth daily.   gabapentin (NEURONTIN) 400 MG capsule Take 2 capsules (800 mg total) by mouth at bedtime. Please call [phone] to schedule yearly appt or may request future refills from PCP.   pantoprazole (PROTONIX) 40 MG tablet Take 40 mg by mouth every morning.    Pseudoephedrine HCl (SUDAFED PO) Take 120 mg by mouth as needed.    rizatriptan (MAXALT-MLT) 5 MG disintegrating tablet Take 1 tablet (5 mg total) by mouth as needed. May repeat in 2 hours if needed   traMADol (ULTRAM) 50 MG tablet Take 50 mg by mouth as needed.    No current facility-administered medications for  this visit. (Other)      REVIEW OF SYSTEMS:    ALLERGIES No Known Allergies  PAST MEDICAL HISTORY Past Medical History:  Diagnosis Date   Chronic sinusitis    DDD (degenerative disc disease), lumbar    ED (erectile dysfunction)    GERD (gastroesophageal reflux disease)    History of colon polyps    2005--  benign   History of diverticulitis of colon    History of prostate cancer    s/p  radical prostatectomy 08-01-2010--  followed by dr borden   Internal hemorrhoid, bleeding    Pain in testis    post prostatectomy   Sigmoid diverticulosis    SUI (stress urinary incontinence), male    Past Surgical History:  Procedure Laterality Date   CATARACT EXTRACTION W/ INTRAOCULAR LENS IMPLANT Right 2012   COLONOSCOPY  last one 2014   ETHMOIDECTOMY  [place] Bilateral left  02-25-2007//   right -- as infant   RETINAL DETACHMENT SURGERY Right 2012   ROBOT ASSISTED LAPAROSCOPIC RADICAL PROSTATECTOMY  08-01-2010   and RIGHT INGUINAL HERNIA REPAIR   TRANSANAL HEMORRHOIDAL DEARTERIALIZATION N/A 12/22/2014  Procedure: TRANSANAL HEMORRHOIDAL DEARTERIALIZATION;  Surgeon: Leighton Ruff, MD;  Location: [facility];  Service: General;  Laterality: N/A;    FAMILY HISTORY Family History  Problem Relation Age of Onset   Colon cancer Father 19   Breast cancer Sister     SOCIAL HISTORY Social History   Tobacco Use   Smoking status: Former    Types: Pipe, Cigars    Quit date: 09/22/1990    Years since quitting: 31.1   Smokeless tobacco: Never  Vaping Use   Vaping Use: Never used  Substance Use Topics   Alcohol use: Yes    Comment: 3 drinks/week   Drug use: No         OPHTHALMIC EXAM:  Base Eye Exam     Visual Acuity (ETDRS)       Right Left   Dist [place] 20/20 -1 20/20    Correction: Glasses         Tonometry (Tonopen, 8:13 AM)       Right Left   Pressure 13 13         Pupils       Pupils Dark Light  Shape React APD   Right PERRL 4 3 Round Brisk None   Left PERRL 4 3 Round Brisk None         Visual Fields (Counting fingers)       Left Right    Full Full         Extraocular Movement       Right Left    Full, Ortho Full, Ortho         Neuro/Psych     Oriented x3: Yes   Mood/Affect: Normal         Dilation     Both eyes: 1.0% Mydriacyl, 2.5% Phenylephrine @ 8:13 AM           Slit Lamp and Fundus Exam     External Exam       Right Left   External Normal Normal         Slit Lamp Exam       Right Left   Lids/Lashes Normal Normal   Conjunctiva/Sclera White and quiet White and quiet   Cornea Clear Clear   Anterior Chamber Deep and quiet Deep and quiet   Iris Round and reactive Round and reactive   Lens Posterior chamber intraocular lens Posterior chamber intraocular lens   Anterior Vitreous Normal Normal         Fundus Exam       Right Left   Posterior Vitreous vitrectomized Normal   Disc Normal Normal   C/D Ratio 0.35 0.3   Macula Normal Normal   Vessels Normal Normal   Periphery good cr scars, superiorly Normal            IMAGING AND PROCEDURES  Imaging and Procedures for 10/29/21  OCT, Retina - OU - Both Eyes       Right Eye Quality was good. Scan locations included subfoveal. Central Foveal Thickness: 411. Progression has improved. Findings include abnormal foveal contour.   Left Eye Quality was good. Scan locations included subfoveal. Central Foveal Thickness: 295. Progression has been stable.   Notes No significant change in macular thickening right eye.  Slightly less thickening  There is a small new area of intraretinal cystoid change which is microscopic and not likely MAC-TEL at this time but it is new and deserves observation right eye  ASSESSMENT/PLAN:  Cystoid macular edema, right eye Resolved OD  Right epiretinal membrane Still improving thickening OD  Posterior vitreous detachment of  left eye OS, no holes or tears      ICD-10-CM   1. Right epiretinal membrane  H35.371 OCT, Retina - OU - Both Eyes    2. History of vitrectomy  Z98.890 OCT, Retina - OU - Both Eyes    3. Posterior vitreous detachment of left eye  H43.812     4. Cystoid macular edema, right eye  H35.351       1.  OD with a history of epiretinal membrane macular pucker vitrectomy, visual acuity is continued improved still has some minor residual distortion, overall vastly improved from baseline.  2.  No new retinal holes or tears OU  3.  RV as needed or in 18 to 45-month Ophthalmic Meds Ordered this visit:  No orders of the defined types were placed in this encounter.      Return in about 2 years (around 10/30/2023) for DILATE OU, COLOR FP, OCT.  There are no Patient Instructions on file for this visit.   Explained the diagnoses, plan, and follow up with the patient and they expressed understanding.  Patient expressed understanding of the importance of proper follow up care.   GClent DemarkRankin M.D. Diseases & Surgery of the Retina and Vitreous [facility]02/07/23     Abbreviations: M myopia (nearsighted); A astigmatism; H hyperopia (farsighted); P presbyopia; Mrx spectacle prescription;  CTL contact lenses; OD right eye; OS left eye; OU both eyes  XT exotropia; ET esotropia; PEK punctate epithelial keratitis; PEE punctate epithelial erosions; DES dry eye syndrome; MGD meibomian gland dysfunction; ATs artificial tears; PFAT's preservative free artificial tears; NSunsetnuclear sclerotic cataract; PSC posterior subcapsular cataract; ERM epi-retinal membrane; PVD posterior vitreous detachment; RD retinal detachment; DM diabetes mellitus; DR diabetic retinopathy; NPDR non-proliferative diabetic retinopathy; PDR proliferative diabetic retinopathy; CSME clinically significant macular edema; DME diabetic macular edema; dbh dot blot hemorrhages; CWS cotton wool spot; POAG primary open  angle glaucoma; C/D cup-to-disc ratio; HVF humphrey visual field; GVF goldmann visual field; OCT optical coherence tomography; IOP intraocular pressure; BRVO Branch retinal vein occlusion; CRVO central retinal vein occlusion; CRAO central retinal artery occlusion; BRAO branch retinal artery occlusion; RT retinal tear; SB scleral buckle; PPV pars plana vitrectomy; VH Vitreous hemorrhage; PRP panretinal laser photocoagulation; IVK intravitreal kenalog; VMT vitreomacular traction; MH Macular hole;  NVD neovascularization of the disc; NVE neovascularization elsewhere; AREDS age related eye disease study; ARMD age related macular degeneration; POAG primary open angle glaucoma; EBMD epithelial/anterior basement membrane dystrophy; ACIOL anterior chamber intraocular lens; IOL intraocular lens; PCIOL posterior chamber intraocular lens; Phaco/IOL phacoemulsification with intraocular lens placement; PFairlawnphotorefractive keratectomy; LASIK laser assisted in situ keratomileusis; HTN hypertension; DM diabetes mellitus; COPD chronic obstructive pulmonary disease

## 2021-10-29 NOTE — Assessment & Plan Note (Signed)
Still improving thickening OD

## 2021-10-29 NOTE — Assessment & Plan Note (Signed)
Resolved OD 

## 2022-02-11 ENCOUNTER — Other Ambulatory Visit (HOSPITAL_COMMUNITY): Payer: Self-pay

## 2022-11-27 ENCOUNTER — Encounter (INDEPENDENT_AMBULATORY_CARE_PROVIDER_SITE_OTHER): Payer: Medicare Other | Admitting: Ophthalmology

## 2023-02-11 IMAGING — US US ABDOMINAL AORTA SCREENING AAA
1 series · 14 of 25 positions shown · non-contrast
Comparison: None.

CLINICAL DATA: 70-year-old male with significant smoking history.

EXAM:
US ABDOMINAL AORTA MEDICARE SCREENING
TECHNIQUE: Ultrasound examination of the abdominal aorta was performed as a
screening evaluation for abdominal aortic aneurysm.

[Series 1: us abdominal aorta screening aaa · 0.28mm/px · 14 of 28 slices shown]
[im 1/28]
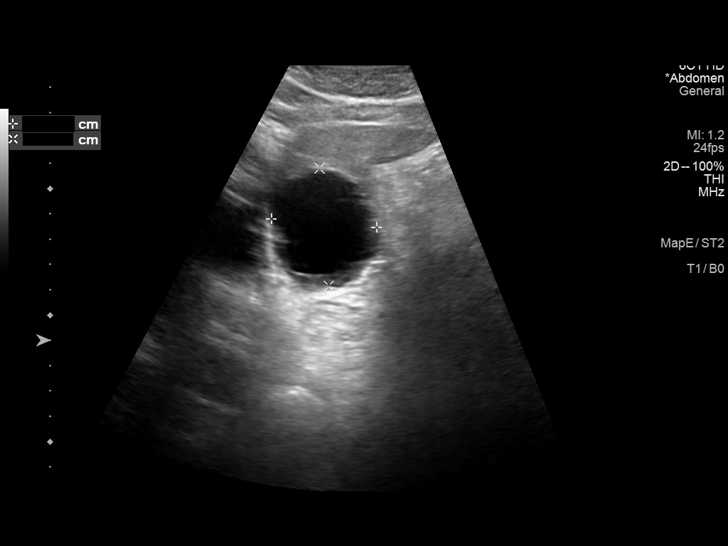
[im 3/28]
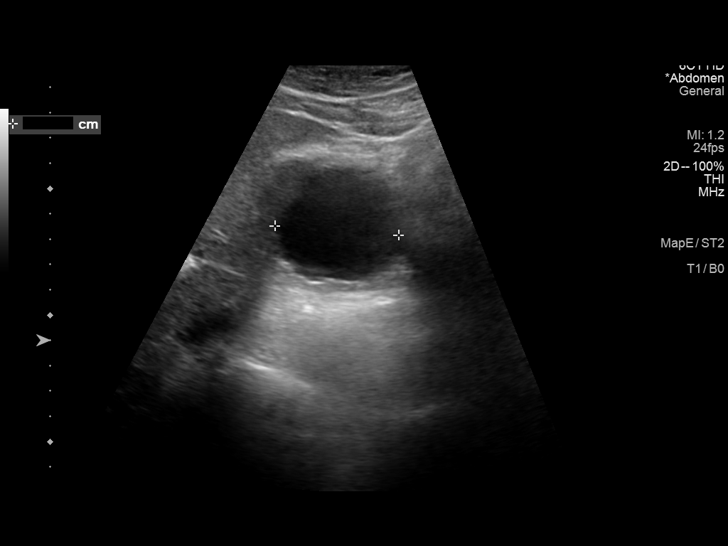
[im 5/28]
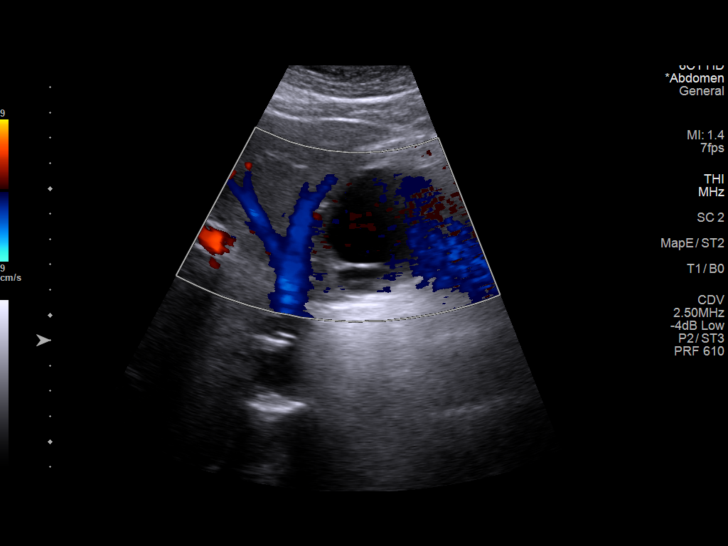
[im 7/28]
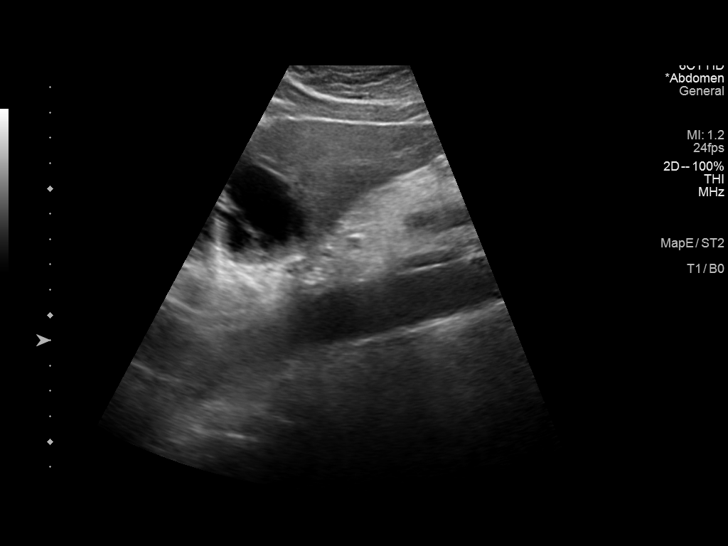
[im 10/28]
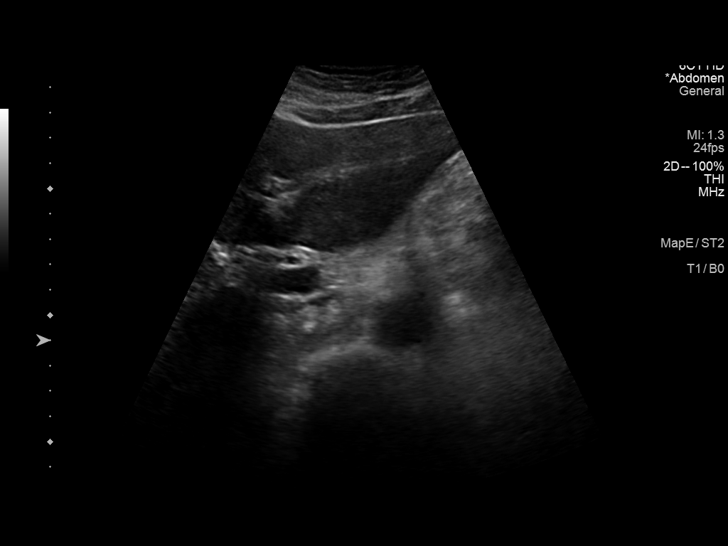
[im 11/28]
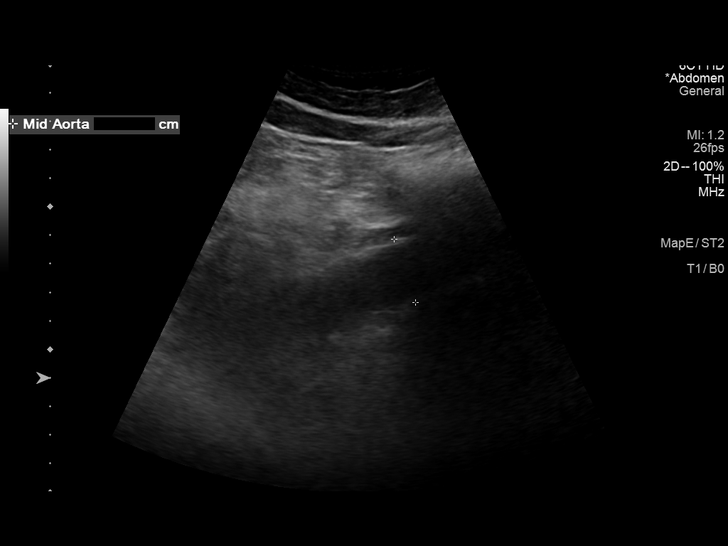
[im 13/28]
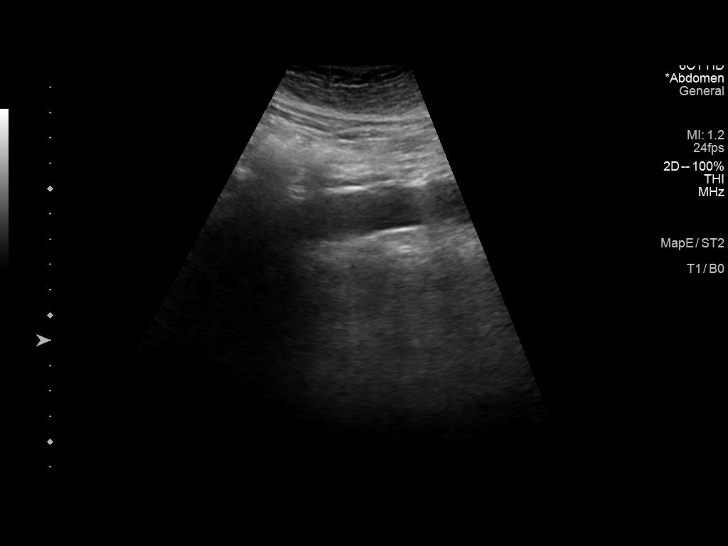
[im 15/28]
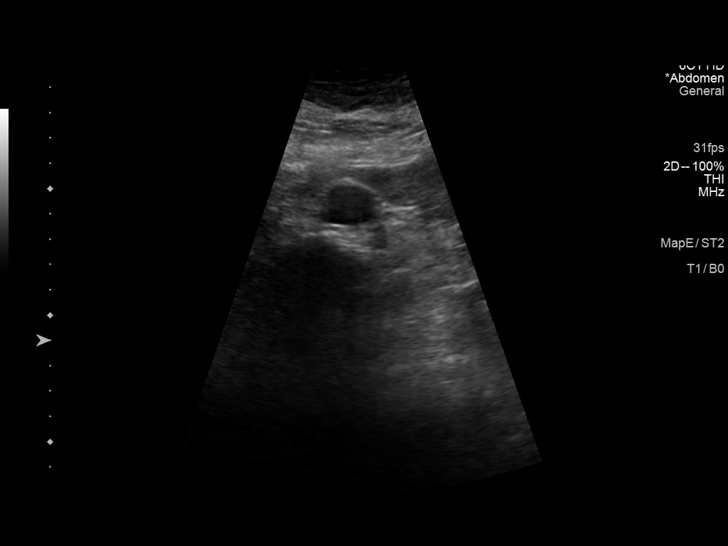
[im 17/28]
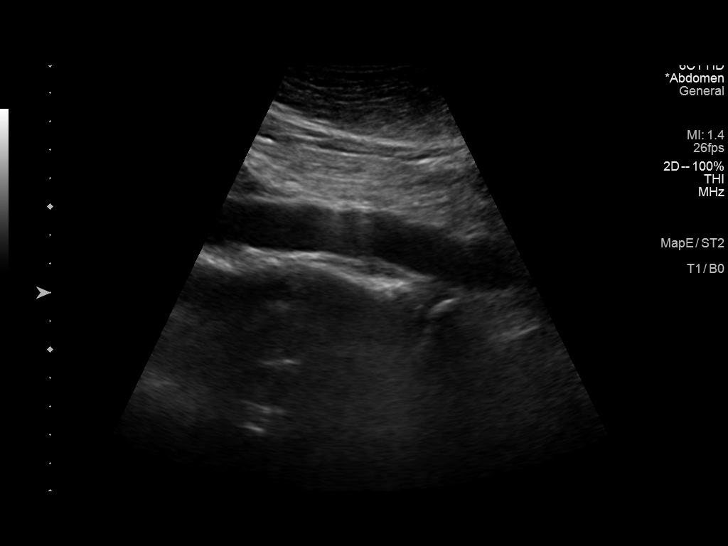
[im 19/28]
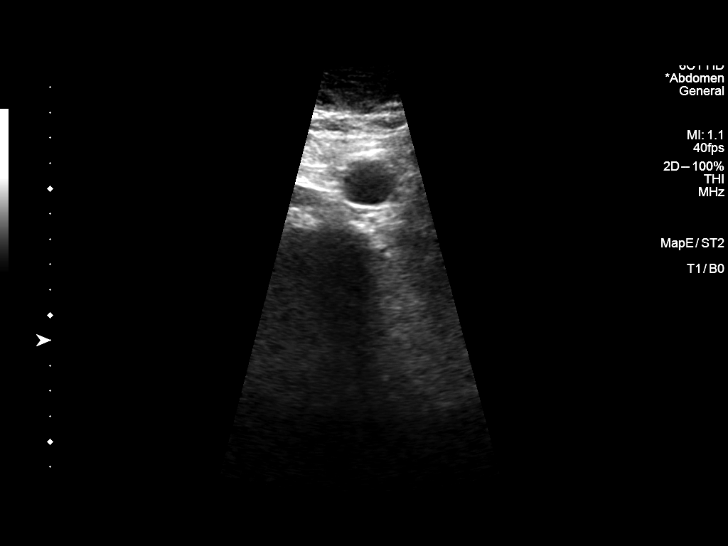
[im 21/28]
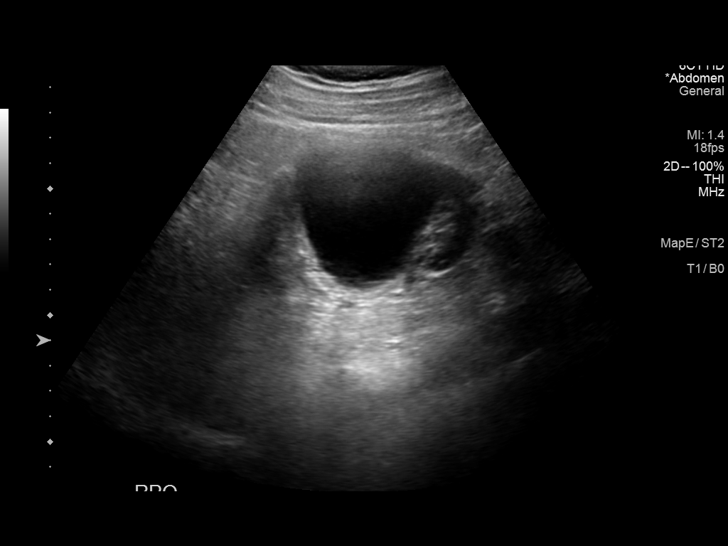
[im 23/28]
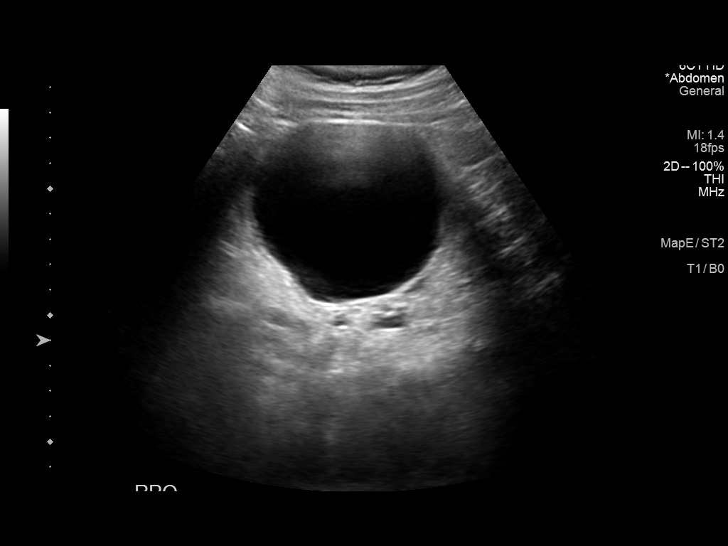
[im 25/28]
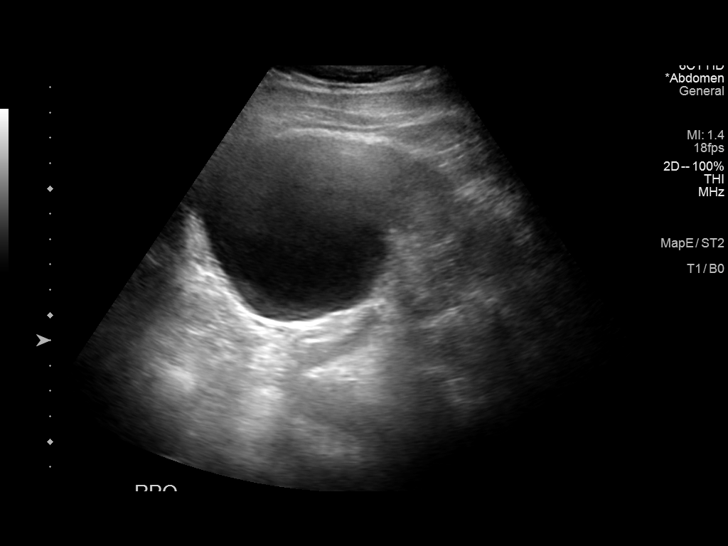
[im 28/28]
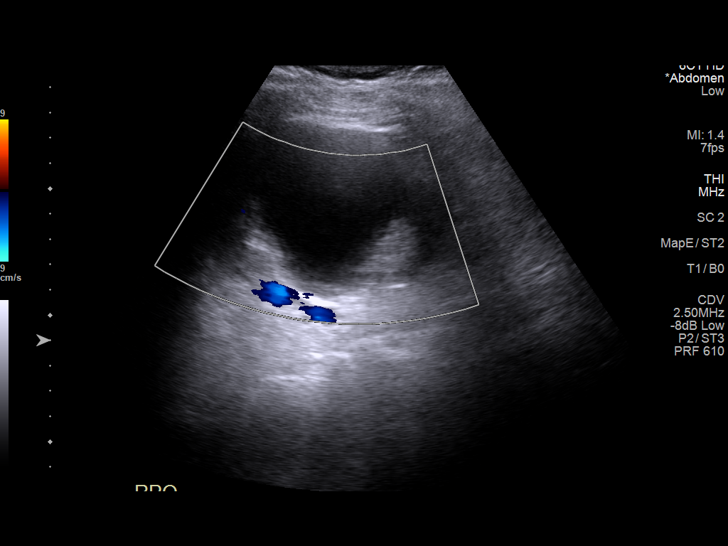

[14 of 25 positions shown; findings below may reference images not displayed]

FINDINGS: Abdominal aortic measurements as follows:

Proximal:  2.5 x 2.5 cm

Mid:  2.3 x 2.2 cm

Distal:  2.1 x 2.1 cm

Incidentally noted are benign appearing cystic lesions within the
left liver and left kidney.
IMPRESSION: No abdominal aortic aneurysm.
